# Patient Record
Sex: Female | Born: 1960 | Race: White | Hispanic: No | Marital: Married
Health system: Midwestern US, Academic
[De-identification: ages and names within clinical notes are randomized; demographics above are authoritative.]

## PROBLEM LIST (undated history)

## (undated) DIAGNOSIS — N189 Chronic kidney disease, unspecified: Principal | ICD-10-CM

## (undated) DIAGNOSIS — D631 Anemia in chronic kidney disease: Principal | ICD-10-CM

## (undated) DIAGNOSIS — K909 Intestinal malabsorption, unspecified: Principal | ICD-10-CM

## (undated) DIAGNOSIS — D508 Other iron deficiency anemias: Secondary | ICD-10-CM

## (undated) HISTORY — DX: Chronic kidney disease, unspecified: N18.9

## (undated) HISTORY — PX: CHOLECYSTECTOMY: SHX55

## (undated) HISTORY — DX: Intestinal malabsorption, unspecified: K90.9

## (undated) HISTORY — PX: APPENDECTOMY: SHX54

## (undated) HISTORY — PX: ABDOMINAL HYSTERECTOMY: SHX81

## (undated) HISTORY — DX: Anemia in chronic kidney disease: D63.1

## (undated) HISTORY — DX: Other iron deficiency anemias: D50.8

---

## 2010-12-19 DIAGNOSIS — E010 Iodine-deficiency related diffuse (endemic) goiter: Secondary | ICD-10-CM | POA: Insufficient documentation

## 2010-12-19 DIAGNOSIS — D649 Anemia, unspecified: Secondary | ICD-10-CM | POA: Insufficient documentation

## 2010-12-19 DIAGNOSIS — K644 Residual hemorrhoidal skin tags: Secondary | ICD-10-CM | POA: Insufficient documentation

## 2011-05-08 ENCOUNTER — Encounter (HOSPITAL_COMMUNITY): Payer: Self-pay | Admitting: *Deleted

## 2011-05-08 ENCOUNTER — Emergency Department (HOSPITAL_COMMUNITY)
Admission: EM | Admit: 2011-05-08 | Discharge: 2011-05-08 | Disposition: A | Discharge status: Home / Self Care | Payer: Managed Care, Other (non HMO) | Attending: Emergency Medicine | Admitting: Emergency Medicine

## 2011-05-08 DIAGNOSIS — K529 Noninfective gastroenteritis and colitis, unspecified: Secondary | ICD-10-CM

## 2011-05-08 DIAGNOSIS — E119 Type 2 diabetes mellitus without complications: Secondary | ICD-10-CM | POA: Insufficient documentation

## 2011-05-08 DIAGNOSIS — K5289 Other specified noninfective gastroenteritis and colitis: Secondary | ICD-10-CM | POA: Insufficient documentation

## 2011-05-08 DIAGNOSIS — R6883 Chills (without fever): Secondary | ICD-10-CM | POA: Insufficient documentation

## 2011-05-08 DIAGNOSIS — R109 Unspecified abdominal pain: Secondary | ICD-10-CM | POA: Insufficient documentation

## 2011-05-08 DIAGNOSIS — R112 Nausea with vomiting, unspecified: Secondary | ICD-10-CM | POA: Insufficient documentation

## 2011-05-08 LAB — URINALYSIS, ROUTINE W REFLEX MICROSCOPIC
Nitrite: NEGATIVE
Specific Gravity, Urine: 1.005 (ref 1.005–1.030)
Urobilinogen, UA: 0.2 mg/dL (ref 0.0–1.0)

## 2011-05-08 LAB — DIFFERENTIAL
Basophils Relative: 0 % (ref 0–1)
Eosinophils Absolute: 0 10*3/uL (ref 0.0–0.7)
Eosinophils Relative: 0 % (ref 0–5)
Lymphs Abs: 0.9 10*3/uL (ref 0.7–4.0)
Monocytes Relative: 14 % — ABNORMAL HIGH (ref 3–12)

## 2011-05-08 LAB — KETONES, QUALITATIVE: Acetone, Bld: NEGATIVE

## 2011-05-08 LAB — COMPREHENSIVE METABOLIC PANEL
BUN: 22 mg/dL (ref 6–23)
Calcium: 9.9 mg/dL (ref 8.4–10.5)
GFR calc Af Amer: 78 mL/min — ABNORMAL LOW (ref >90)
Glucose, Bld: 243 mg/dL — ABNORMAL HIGH (ref 70–99)
Total Protein: 6.6 g/dL (ref 6.0–8.3)

## 2011-05-08 LAB — CBC
MCH: 29.7 pg (ref 26.0–34.0)
MCHC: 33.9 g/dL (ref 30.0–36.0)
MCV: 87.6 fL (ref 78.0–100.0)
Platelets: 229 10*3/uL (ref 150–400)
RBC: 3.54 MIL/uL — ABNORMAL LOW (ref 3.87–5.11)

## 2011-05-08 LAB — URINE MICROSCOPIC-ADD ON

## 2011-05-08 MED ORDER — PROMETHAZINE HCL 25 MG/ML IJ SOLN
12.5000 mg | Freq: Once | INTRAMUSCULAR | Status: AC
  Administered 2011-05-08: 12.5 mg via INTRAVENOUS
  Filled 2011-05-08: qty 1

## 2011-05-08 MED ORDER — ONDANSETRON HCL 4 MG PO TABS: 4.0000 mg | ORAL_TABLET | Freq: Four times a day (QID) | ORAL | Status: AC

## 2011-05-08 MED ORDER — SODIUM CHLORIDE 0.9 % IV SOLN
Freq: Once | INTRAVENOUS | Status: AC
  Administered 2011-05-08: 07:00:00 via INTRAVENOUS

## 2011-05-08 MED ORDER — KETOROLAC TROMETHAMINE 30 MG/ML IJ SOLN
30.0000 mg | Freq: Once | INTRAMUSCULAR | Status: AC
  Administered 2011-05-08: 30 mg via INTRAVENOUS
  Filled 2011-05-08: qty 1

## 2011-05-08 NOTE — ED Provider Notes (Signed)
History     CSN: [id number]  Arrival date & time 05/08/11  0545   First MD Initiated Contact with Patient 05/08/11 0557      Chief Complaint  Patient presents with  . Nausea    (Consider location/radiation/quality/duration/timing/severity/associated sxs/prior treatment) HPI Comments: Patient has history of IDDM, cholecystectomy and appendectomy.  Patient is a 51 y.o. female presenting with vomiting. The history is provided by the patient.  Emesis  This is a new problem. Episode onset: 3 days ago. The problem occurs continuously. The problem has been gradually worsening. The emesis has an appearance of stomach contents. There has been no fever. Associated symptoms include abdominal pain and chills. Pertinent negatives include no diarrhea and no fever.    Past Medical History  Diagnosis Date  . Diabetes mellitus     Past Surgical History  Procedure Date  . Abdominal hysterectomy   . Appendectomy   . Cholecystectomy     No family history on file.  History  Substance Use Topics  . Smoking status: Never Smoker   . Smokeless tobacco: Not on file  . Alcohol Use: Yes    OB History    Grav Para Term Preterm Abortions TAB SAB Ect Mult Living                  Review of Systems  Constitutional: Positive for chills. Negative for fever.  Gastrointestinal: Positive for vomiting and abdominal pain. Negative for diarrhea.  All other systems reviewed and are negative.    Allergies  Penicillins and Sulfa antibiotics  Home Medications  No current outpatient prescriptions on file.  BP 131/88  Pulse 106  Temp(Src) 98.3 F (36.8 C) (Oral)  Resp 20  SpO2 99%  Physical Exam  Nursing note and vitals reviewed. Constitutional: She is oriented to person, place, and time. She appears well-developed and well-nourished. No distress.  HENT:  Head: Normocephalic and atraumatic.  Mouth/Throat: Oropharynx is clear and moist. No oropharyngeal exudate.  Neck: Normal range of  motion. Neck supple.  Cardiovascular: Normal rate and regular rhythm.  Exam reveals no gallop and no friction rub.   No murmur heard. Pulmonary/Chest: Effort normal and breath sounds normal. No respiratory distress. She has no wheezes.  Abdominal: Soft. Bowel sounds are normal. She exhibits no distension. There is no tenderness.  Musculoskeletal: Normal range of motion.  Neurological: She is alert and oriented to person, place, and time.  Skin: Skin is warm and dry. She is not diaphoretic.    ED Course  Procedures (including critical care time)   Labs Reviewed  CBC  DIFFERENTIAL  COMPREHENSIVE METABOLIC PANEL  KETONES, QUALITATIVE  URINALYSIS, ROUTINE W REFLEX MICROSCOPIC   No results found.   No diagnosis found.    MDM  She is feeling better after the medications given.  The electrolytes do not reflect DKA.  The abdomen is benign and the wbc is normal.  She will be discharged to home with gastroenteritis as the suspected etiology.       Geoffery Lyons, MD 05/08/11 [id number]

## 2011-05-08 NOTE — Discharge Instructions (Signed)
Viral Gastroenteritis Viral gastroenteritis is also known as stomach flu. This condition affects the stomach and intestinal tract. It can cause sudden diarrhea and vomiting. The illness typically lasts 3 to 8 days. Most people develop an immune response that eventually gets rid of the virus. While this natural response develops, the virus can make you quite ill. CAUSES  Many different viruses can cause gastroenteritis, such as rotavirus or noroviruses. You can catch one of these viruses by consuming contaminated food or water. You may also catch a virus by sharing utensils or other personal items with an infected person or by touching a contaminated surface. SYMPTOMS  The most common symptoms are diarrhea and vomiting. These problems can cause a severe loss of body fluids (dehydration) and a body salt (electrolyte) imbalance. Other symptoms may include:  Fever.   Headache.   Fatigue.   Abdominal pain.  DIAGNOSIS  Your caregiver can usually diagnose viral gastroenteritis based on your symptoms and a physical exam. A stool sample may also be taken to test for the presence of viruses or other infections. TREATMENT  This illness typically goes away on its own. Treatments are aimed at rehydration. The most serious cases of viral gastroenteritis involve vomiting so severely that you are not able to keep fluids down. In these cases, fluids must be given through an intravenous line (IV). HOME CARE INSTRUCTIONS   Drink enough fluids to keep your urine clear or pale yellow. Drink small amounts of fluids frequently and increase the amounts as tolerated.   Ask your caregiver for specific rehydration instructions.   Avoid:   Foods high in sugar.   Alcohol.   Carbonated drinks.   Tobacco.   Juice.   Caffeine drinks.   Extremely hot or cold fluids.   Fatty, greasy foods.   Too much intake of anything at one time.   Dairy products until 24 to 48 hours after diarrhea stops.   You may  consume probiotics. Probiotics are active cultures of beneficial bacteria. They may lessen the amount and number of diarrheal stools in adults. Probiotics can be found in yogurt with active cultures and in supplements.   Wash your hands well to avoid spreading the virus.   Only take over-the-counter or prescription medicines for pain, discomfort, or fever as directed by your caregiver. Do not give aspirin to children. Antidiarrheal medicines are not recommended.   Ask your caregiver if you should continue to take your regular prescribed and over-the-counter medicines.   Keep all follow-up appointments as directed by your caregiver.  SEEK IMMEDIATE MEDICAL CARE IF:   You are unable to keep fluids down.   You do not urinate at least once every 6 to 8 hours.   You develop shortness of breath.   You notice blood in your stool or vomit. This may look like coffee grounds.   You have abdominal pain that increases or is concentrated in one small area (localized).   You have persistent vomiting or diarrhea.   You have a fever.   The patient is a child younger than 3 months, and he or she has a fever.   The patient is a child older than 3 months, and he or she has a fever and persistent symptoms.   The patient is a child older than 3 months, and he or she has a fever and symptoms suddenly get worse.   The patient is a baby, and he or she has no tears when crying.  MAKE SURE YOU:     Understand these instructions.   Will watch your condition.   Will get help right away if you are not doing well or get worse.  Document Released: 02/02/2005 Document Revised: 01/22/2011 Document Reviewed: 11/19/2010 [employer] Patient Information 2012 [employer]. 

## 2011-05-08 NOTE — ED Notes (Signed)
Pt reports generalized abd pain and nausea starting 3 days ago

## 2012-05-02 DIAGNOSIS — E785 Hyperlipidemia, unspecified: Secondary | ICD-10-CM | POA: Insufficient documentation

## 2012-05-02 DIAGNOSIS — Z8349 Family history of other endocrine, nutritional and metabolic diseases: Secondary | ICD-10-CM | POA: Insufficient documentation

## 2012-05-02 DIAGNOSIS — N951 Menopausal and female climacteric states: Secondary | ICD-10-CM | POA: Insufficient documentation

## 2012-05-02 DIAGNOSIS — Z9289 Personal history of other medical treatment: Secondary | ICD-10-CM | POA: Insufficient documentation

## 2012-05-02 DIAGNOSIS — E109 Type 1 diabetes mellitus without complications: Secondary | ICD-10-CM | POA: Insufficient documentation

## 2012-05-02 DIAGNOSIS — Z90710 Acquired absence of both cervix and uterus: Secondary | ICD-10-CM | POA: Insufficient documentation

## 2012-06-07 ENCOUNTER — Other Ambulatory Visit: Payer: Self-pay | Admitting: Endocrinology

## 2012-06-07 DIAGNOSIS — E049 Nontoxic goiter, unspecified: Secondary | ICD-10-CM

## 2012-06-10 ENCOUNTER — Ambulatory Visit
Admission: RE | Admit: 2012-06-10 | Discharge: 2012-06-10 | Disposition: A | Discharge status: Home / Self Care | Payer: Managed Care, Other (non HMO) | Source: Ambulatory Visit | Attending: Endocrinology | Admitting: Endocrinology

## 2012-06-10 DIAGNOSIS — E049 Nontoxic goiter, unspecified: Secondary | ICD-10-CM

## 2012-06-20 ENCOUNTER — Other Ambulatory Visit: Payer: Self-pay | Admitting: Endocrinology

## 2012-06-20 DIAGNOSIS — E042 Nontoxic multinodular goiter: Secondary | ICD-10-CM

## 2012-06-21 ENCOUNTER — Other Ambulatory Visit: Payer: Managed Care, Other (non HMO)

## 2012-06-22 ENCOUNTER — Ambulatory Visit
Admission: RE | Admit: 2012-06-22 | Discharge: 2012-06-22 | Disposition: A | Discharge status: Home / Self Care | Payer: Managed Care, Other (non HMO) | Source: Ambulatory Visit | Attending: Endocrinology | Admitting: Endocrinology

## 2012-06-22 ENCOUNTER — Other Ambulatory Visit (HOSPITAL_COMMUNITY)
Admission: RE | Admit: 2012-06-22 | Discharge: 2012-06-22 | Disposition: A | Discharge status: Home / Self Care | Payer: Managed Care, Other (non HMO) | Source: Ambulatory Visit | Attending: Interventional Radiology | Admitting: Interventional Radiology

## 2012-06-22 DIAGNOSIS — E042 Nontoxic multinodular goiter: Secondary | ICD-10-CM

## 2012-06-22 DIAGNOSIS — E049 Nontoxic goiter, unspecified: Secondary | ICD-10-CM | POA: Insufficient documentation

## 2012-08-02 DIAGNOSIS — E041 Nontoxic single thyroid nodule: Secondary | ICD-10-CM | POA: Insufficient documentation

## 2012-08-02 DIAGNOSIS — E8940 Asymptomatic postprocedural ovarian failure: Secondary | ICD-10-CM | POA: Insufficient documentation

## 2012-10-05 ENCOUNTER — Encounter: Payer: Self-pay | Admitting: Nurse Practitioner

## 2012-10-20 ENCOUNTER — Ambulatory Visit: Payer: Self-pay | Admitting: Nurse Practitioner

## 2012-10-25 ENCOUNTER — Ambulatory Visit: Payer: Self-pay | Admitting: Nurse Practitioner

## 2012-11-16 ENCOUNTER — Other Ambulatory Visit: Payer: Self-pay | Admitting: Nurse Practitioner

## 2012-11-16 ENCOUNTER — Other Ambulatory Visit: Payer: Self-pay | Admitting: Endocrinology

## 2012-11-16 DIAGNOSIS — E049 Nontoxic goiter, unspecified: Secondary | ICD-10-CM

## 2012-11-16 NOTE — Telephone Encounter (Signed)
Left Message To Call Back  

## 2012-11-17 NOTE — Telephone Encounter (Signed)
I have attempted to contact this patient by phone with the following results: left message to return my call on answering machine (home/mobile).  

## 2012-11-18 NOTE — Telephone Encounter (Signed)
Pt returning call

## 2012-11-18 NOTE — Telephone Encounter (Signed)
Pt returned call.  AEX scheduled. 30 day supply sent and pt states she has a few pills left.

## 2012-11-18 NOTE — Telephone Encounter (Signed)
Message left to call back to schedule AEX in order to get refill.

## 2012-12-20 ENCOUNTER — Ambulatory Visit: Payer: Self-pay | Admitting: Nurse Practitioner

## 2012-12-30 ENCOUNTER — Telehealth: Payer: Self-pay | Admitting: Hematology & Oncology

## 2012-12-30 NOTE — Telephone Encounter (Signed)
I spoke w NEW PATIENT today to remind them of their appointment with Dr. Ennever. Also, advised them to bring all meds and insurance information. ° °

## 2013-01-02 ENCOUNTER — Ambulatory Visit (HOSPITAL_BASED_OUTPATIENT_CLINIC_OR_DEPARTMENT_OTHER): Payer: Managed Care, Other (non HMO) | Admitting: Hematology & Oncology

## 2013-01-02 ENCOUNTER — Other Ambulatory Visit (HOSPITAL_BASED_OUTPATIENT_CLINIC_OR_DEPARTMENT_OTHER): Payer: Managed Care, Other (non HMO) | Admitting: Lab

## 2013-01-02 ENCOUNTER — Ambulatory Visit: Payer: Managed Care, Other (non HMO)

## 2013-01-02 VITALS — BP 118/57 | HR 76 | Temp 98.3°F | Resp 14 | Ht 67.0 in | Wt 177.0 lb

## 2013-01-02 DIAGNOSIS — D649 Anemia, unspecified: Secondary | ICD-10-CM

## 2013-01-02 LAB — CBC WITH DIFFERENTIAL (CANCER CENTER ONLY)
BASO#: 0 10*3/uL (ref 0.0–0.2)
EOS%: 2.5 % (ref 0.0–7.0)
HGB: 10.7 g/dL — ABNORMAL LOW (ref 11.6–15.9)
MCH: 29.9 pg (ref 26.0–34.0)
MCHC: 32.5 g/dL (ref 32.0–36.0)
MONO%: 9.6 % (ref 0.0–13.0)
NEUT#: 3.6 10*3/uL (ref 1.5–6.5)
Platelets: 247 10*3/uL (ref 145–400)

## 2013-01-02 NOTE — Progress Notes (Signed)
This office note has been dictated.

## 2013-01-03 NOTE — Addendum Note (Signed)
Addended by: Arlan Organ R on: 01/03/2013 08:12 PM   Modules accepted: Orders

## 2013-01-04 ENCOUNTER — Telehealth: Payer: Self-pay | Admitting: Hematology & Oncology

## 2013-01-04 NOTE — Telephone Encounter (Signed)
Mailed February schedule °

## 2013-01-05 LAB — VITAMIN B12: Vitamin B-12: 563 pg/mL (ref 211–911)

## 2013-01-05 LAB — HEMOGLOBINOPATHY EVALUATION
Hemoglobin Other: 0 %
Hgb A: 97.2 % (ref 96.8–97.8)

## 2013-01-05 LAB — RETICULOCYTES (CHCC): ABS Retic: 33.3 10*3/uL (ref 19.0–186.0)

## 2013-01-07 NOTE — Progress Notes (Signed)
CC:   Maryelizabeth Rowan, M.D. Dorisann Frames, M.D.  DIAGNOSIS:  Normochromic normocytic anemia.  HISTORY OF PRESENT ILLNESS:  Ms. Chismar is an incredibly charming 52 year old white female.  She just had a birthday a couple weeks ago.  She lives in [place], [place].  Of course, then we had a great talk about football and the [employer].  She and her husband have been here for, I think, couple of years.  He works for [employer].  She works for a [employer].  She is followed by Dr. Anna Genre.  Ms. Judy has a long history of diabetes.  She has a history of 30 years of diabetes.  She is on an insulin pump.  She says that her hemoglobin A1c has still been on the high side.  There have been some issues with the insurance company trying to get the pump __________ taken care.  She has had issues with anemia.  She is not symptomatic with this from my point of view.  Lab work that we have on her that was sent over by her doctors office shows her to have some degree of anemia.  Lab work back from October 14 shows a hemoglobin of 10.8, hematocrit 33.  White cell count was 6.6. MCV was 90.  She has a normal BUN and creatinine.  She has normal liver function tests.  She does not crave ice.  She has not had a monthly cycle for several years.  She had a hysterectomy, I think, about 4 or 5 years ago.  She has had no persistent rashes.  She has had no swollen lymph glands.  She has her mammograms yearly.  She had a colonoscopy about 6 years ago. She had this, I think, when her gallbladder was taken out.  There has been no weight loss or weight gain.  She is not a vegetarian.  Again, we were asked to see her for the anemia.  PAST MEDICAL HISTORY: 1. Essentially revolves around her diabetes. 2. Hyperlipidemia. 3. Hot flashes. 4. Hypothyroidism.  ALLERGIES: 1. Penicillin. 2. Sulfa antibiotics.  MEDICATIONS:  Aspirin 81 mg p.o. daily, Enjuvia 0.45 mg p.o.  daily, Prozac 40 mg p.o. daily, Neurontin 300 mg p.o. t.i.d., insulin pump, Synthroid 0.075 mg daily, Zestril 10 mg p.o. daily, Zocor 40 mg p.o. daily.  SOCIAL HISTORY:  Negative for tobacco use.  She has social alcohol use. She likes beer.  She has no obvious occupational exposure.  FAMILY HISTORY:  Pretty much unrevealing for anemia.  There is no diabetes in the family.  There is no history of colon cancer in the family.  REVIEW OF SYSTEMS:  As stated in history of present illness.  No additional findings noted on a 12-system review.  PHYSICAL EXAMINATION:  General:  This is a well-developed, well- nourished white female, in no obvious distress.  Vital signs: Temperature of 98.3, pulse 76, respiratory rate 14, blood pressure 118/57.  Weight is 177 pounds.  Head and Neck:  Normocephalic, atraumatic skull.  There are no ocular or oral lesions.  She has no scleral icterus.  Pupils react appropriately.  She has no glossitis. There is no adenopathy in the neck.  Thyroid is somewhat enlarged diffusely.  She has no supraclavicular lymph nodes.  Lungs:  Clear bilaterally.  Cardiac:  Regular rate and rhythm with a normal S1 and S2. There are no murmurs, rubs, or bruits.  Abdomen:  Soft.  She has good bowel sounds.  She has some laparoscopy  scars that are well healed.  She has no abdominal mass.  There is no fluid wave.  There is no palpable hepatosplenomegaly.  Back:  No tenderness over the spine, ribs, or hips. Extremities:  No clubbing, cyanosis or edema.  She has good range motion of her joints.  She has good strength in her arms and legs.  Skin:  No rashes, ecchymoses, or petechia.  Neurological:  No focal neurological deficits.  LABORATORY STUDIES:  White cell count 5.6, hemoglobin 10.7, hematocrit 32.9, platelet count 247.  Her retic count is 0.9%.  Peripheral smear shows a normochromic, normocytic population of red blood cells.  I see no nucleated red blood cells.  There are  no microcytic cells.  She has no anisocytosis or poikilocytosis.  There are no spherocytes or schistocytes.  I see no rouleaux formation.  She has no target cells.  White cells appear normal in morphology and maturation.  There are no immature myeloid or lymphoid forms.  She has no atypical lymphocytes.  I see no blasts.  Platelets are adequate in number and size.  IMPRESSION:  Ms. Toya is a very charming 52 year old white female.  She has a 52 year old history of diabetes.  She has had some ophthalmological issues.  She had a cataract removed from her left eye a week ago.  I really have to believe that Ms. Ferg's anemia will be attributed to a low erythropoietin level.  I suspect that after 30 years of diabetes, her kidneys are not making erythropoietin all that efficiently and that she just has a low erythropoietin level.  I think that her reticulocyte count less than 1 is highly indicative of her marrow not getting the "signal" to produce red cells.  Her blood smear, again, is unremarkable.  I could not imagine her having an actual bone marrow disorder.  She clearly does not have any hemolytic process.  She is on a few medications.  I suppose that some anemia could be chronic from her medications.  I do not see any hematologic malignancy that we have to worry about. Her exam is pretty much nonfocal.  Again, I would have to believe that we are looking at erythropoietin deficiency as the cause for her anemia.  I would not put her on any Epogen right now.  I think she is asymptomatic.  I think that any hemoglobin above 10 would be okay for her.  I do not see a need for a bone marrow biopsy.  I do not see a need for any type of endoscopic procedure.  I would not think that she is iron deficient.  Once, we have the results back from our labs, then we will be able to figure out when to get Ms. Peets back.  Again, I spent a good hour with her.  She is very nice.  It was  fun talking to her.  She has a good understanding of what is going on with her blood work. She, again, is not that symptomatic from what I can tell.  Once, we get these results back from her lab work, then we will be able to get her back in.  I probably would like to get her in in about 3 months or so.    ______________________________ Josph Macho, M.D. PRE/MEDQ  D:  01/02/2013  T:  01/07/2013  Job:  [id number]

## 2013-04-04 ENCOUNTER — Ambulatory Visit: Payer: Managed Care, Other (non HMO) | Admitting: Hematology & Oncology

## 2013-04-04 ENCOUNTER — Other Ambulatory Visit: Payer: Managed Care, Other (non HMO) | Admitting: Lab

## 2013-04-05 ENCOUNTER — Other Ambulatory Visit: Payer: Managed Care, Other (non HMO) | Admitting: Lab

## 2013-04-05 ENCOUNTER — Ambulatory Visit: Payer: Managed Care, Other (non HMO) | Admitting: Hematology & Oncology

## 2013-04-07 ENCOUNTER — Other Ambulatory Visit (HOSPITAL_BASED_OUTPATIENT_CLINIC_OR_DEPARTMENT_OTHER): Payer: Managed Care, Other (non HMO) | Admitting: Lab

## 2013-04-07 ENCOUNTER — Encounter: Payer: Self-pay | Admitting: Hematology & Oncology

## 2013-04-07 ENCOUNTER — Ambulatory Visit (HOSPITAL_BASED_OUTPATIENT_CLINIC_OR_DEPARTMENT_OTHER): Payer: Managed Care, Other (non HMO) | Admitting: Hematology & Oncology

## 2013-04-07 VITALS — BP 114/59 | HR 83 | Temp 98.2°F | Resp 14 | Ht 67.0 in | Wt 176.0 lb

## 2013-04-07 DIAGNOSIS — D649 Anemia, unspecified: Secondary | ICD-10-CM

## 2013-04-07 DIAGNOSIS — E119 Type 2 diabetes mellitus without complications: Secondary | ICD-10-CM

## 2013-04-07 DIAGNOSIS — N189 Chronic kidney disease, unspecified: Secondary | ICD-10-CM

## 2013-04-07 DIAGNOSIS — D631 Anemia in chronic kidney disease: Secondary | ICD-10-CM

## 2013-04-07 LAB — CBC WITH DIFFERENTIAL (CANCER CENTER ONLY)
BASO#: 0 10*3/uL (ref 0.0–0.2)
BASO%: 0.2 % (ref 0.0–2.0)
EOS ABS: 0 10*3/uL (ref 0.0–0.5)
EOS%: 0.5 % (ref 0.0–7.0)
HCT: 31.9 % — ABNORMAL LOW (ref 34.8–46.6)
HGB: 10.3 g/dL — ABNORMAL LOW (ref 11.6–15.9)
LYMPH#: 0.9 10*3/uL (ref 0.9–3.3)
LYMPH%: 13.6 % — ABNORMAL LOW (ref 14.0–48.0)
MCH: 29.7 pg (ref 26.0–34.0)
MCHC: 32.3 g/dL (ref 32.0–36.0)
MCV: 92 fL (ref 81–101)
MONO#: 0.6 10*3/uL (ref 0.1–0.9)
MONO%: 9 % (ref 0.0–13.0)
NEUT%: 76.7 % (ref 39.6–80.0)
NEUTROS ABS: 5.1 10*3/uL (ref 1.5–6.5)
PLATELETS: 262 10*3/uL (ref 145–400)
RBC: 3.47 10*6/uL — AB (ref 3.70–5.32)
RDW: 12.9 % (ref 11.1–15.7)
WBC: 6.6 10*3/uL (ref 3.9–10.0)

## 2013-04-07 LAB — CHCC SATELLITE - SMEAR

## 2013-04-07 LAB — RETICULOCYTES (CHCC)
ABS RETIC: 35.3 10*3/uL (ref 19.0–186.0)
RBC.: 3.53 MIL/uL — ABNORMAL LOW (ref 3.87–5.11)
RETIC CT PCT: 1 % (ref 0.4–2.3)

## 2013-04-10 ENCOUNTER — Encounter: Payer: Self-pay | Admitting: Hematology & Oncology

## 2013-04-10 DIAGNOSIS — N189 Chronic kidney disease, unspecified: Secondary | ICD-10-CM

## 2013-04-10 DIAGNOSIS — D631 Anemia in chronic kidney disease: Secondary | ICD-10-CM

## 2013-04-10 HISTORY — DX: Anemia in chronic kidney disease: D63.1

## 2013-04-10 HISTORY — DX: Chronic kidney disease, unspecified: N18.9

## 2013-04-10 NOTE — Progress Notes (Signed)
Hematology and Oncology Follow Up Visit  Sherrine MaplesCheryl Esper [id number] [birth year] 53 y.o. 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  [name] is back for a second office visit. We first saw her back in November. At that point in time, she was mildly anemic. We did our routine anemia studies.we found that she had a markedly low erythropoietin level of 5. Her iron level, measured bytransferrin receptor was good at 1.0 . Her reticulocyte count was less than 1.  She is doing okay. She still gets quite tired. She is working blood 7 more difficulty with work.  She's had no bleeding. He's had no fever. She's had a good appetite. She's not a vegetarian.  We also check her vitamin B 12 level and this was normal at 563.  Medications: Current outpatient prescriptions:aspirin 81 MG tablet, Take 81 mg by mouth daily., Disp: , Rfl: ;  buPROPion (WELLBUTRIN XL) 300 MG 24 hr tablet, Take 300 mg by mouth daily., Disp: , Rfl: ;  estrogens conjugated, synthetic B, (ENJUVIA) 0.45 MG tablet, Take 0.45 mg by mouth daily., Disp: , Rfl: ;  FLUoxetine (PROZAC) 40 MG capsule, Take 40 mg by mouth daily. , Disp: , Rfl: ;  FREESTYLE TEST STRIPS test strip, , Disp: , Rfl:  gabapentin (NEURONTIN) 300 MG capsule, Take 300 mg by mouth 3 (three) times daily. 1 in AM-  1 in PM  & 2 at BEDTIME, Disp: , Rfl: ;  insulin aspart (NOVOLOG) 100 UNIT/ML injection, Inject into the skin 3 (three) times daily before meals. Insulin pump, Disp: , Rfl: ;  levothyroxine (SYNTHROID, LEVOTHROID) 75 MCG tablet, Take 75 mcg by mouth daily before breakfast. , Disp: , Rfl:  lisinopril (PRINIVIL,ZESTRIL) 20 MG tablet, Take 10 mg by mouth daily. , Disp: , Rfl: ;  simvastatin (ZOCOR) 40 MG tablet, Take 40 mg by mouth every evening., Disp: , Rfl:   Allergies:  Allergies  Allergen Reactions  . Penicillins   . Sulfa Antibiotics      Past Medical History, Surgical history, Social history, and Family History were reviewed and updated.  Review of Systems: As above  Physical Exam:  height is 5\' 7"  (1.702 m) and weight is 176 lb (79.833 kg). Her oral temperature is 98.2 F (36.8 C). Her blood pressure is 114/59 and her pulse is 83. Her respiration is 14.   3 well-nourished white female. She is alert and oriented. Head and neck exam shows no ocular or oral lesions. She has no adenopathy. There is no palpable thyroid. Lungs are clear. Cardiac exam regular in rhythm with no murmurs rubs or bruits. Abdomen is soft. She good bowel sounds. There is no fluid wave. There is no palpable liver or spleen tip. Back exam no tenderness over the spine. Extremities shows no clubbing cyanosis or edema. She good strength. Skin exam no rashes. Neurological exam is nonfocal.  Lab Results  Component Value Date   WBC 6.6 04/07/2013   HGB 10.3* 04/07/2013   HCT 31.9* 04/07/2013   MCV 92 04/07/2013   PLT 262 04/07/2013     Chemistry      Component Value Date/Time   NA 134* 05/08/2011 0530   K 4.3 05/08/2011 0530   CL 98 05/08/2011 0530   CO2 29 05/08/2011 0530   BUN 22 05/08/2011 0530   CREATININE 0.98 05/08/2011 0530      Component  Value Date/Time   CALCIUM 9.9 05/08/2011 0530   ALKPHOS 51 05/08/2011 0530   AST 25 05/08/2011 0530   ALT 28 05/08/2011 0530   BILITOT 0.2* 05/08/2011 0530     I looked her blood smear. She is a normochromic normocytic probably separate blood cells. There are no nucleated red blood cells. I see no teardrop cells. There are no schistocytes or spherocytes. White cells per normal in morphology maturation. There are no hypersegmented polys. There are no atypical lymphocytes. Platelets are adequate number size.    Impression and Plan: Ms. Migliaccio is a 53 year old white female. She has anemia. His insulin dependent. She has a pump. She is markedly deficient of erythropoietin.  I think the only weight to improve her  anemia is with ESA administration. I spoke to her about this. I showed her the lab work. I explained  Why her bone marrow cannot make red blood cells. I told her that we can improve this. Iron, for right now, will not help.  I went over the side effects of Aranesp. I told her without a less than 5% risk of a thromboembolic event, stroke or heart attack.  I told her that weight watch her  blood pressure.  We will go ahead and get her set up with her first dose next week.  I want to see her back in 3- 4 weeks and we'll see how she responds.  I spent 40 minutes with her.     Josph Macho, MD 2/23/20156:08 PM

## 2013-04-11 ENCOUNTER — Telehealth: Payer: Self-pay | Admitting: Hematology & Oncology

## 2013-04-11 NOTE — Telephone Encounter (Signed)
Pt aware of 2-25 inj and 3-25 MD. Delice Bisonara aware to precert

## 2013-04-12 ENCOUNTER — Ambulatory Visit: Payer: Managed Care, Other (non HMO)

## 2013-04-19 ENCOUNTER — Telehealth: Payer: Self-pay | Admitting: Hematology & Oncology

## 2013-04-19 NOTE — Telephone Encounter (Signed)
Pt called left message wanting to know status of precert for inj. She is aware per Delice Bisonara they are going to try to get approval to give inj on 3-25.

## 2013-04-20 ENCOUNTER — Ambulatory Visit (HOSPITAL_BASED_OUTPATIENT_CLINIC_OR_DEPARTMENT_OTHER): Payer: Managed Care, Other (non HMO)

## 2013-04-20 ENCOUNTER — Telehealth: Payer: Self-pay | Admitting: Hematology & Oncology

## 2013-04-20 ENCOUNTER — Encounter: Payer: Self-pay | Admitting: Hematology & Oncology

## 2013-04-20 VITALS — BP 110/68 | HR 80 | Temp 98.1°F | Resp 18

## 2013-04-20 DIAGNOSIS — D631 Anemia in chronic kidney disease: Secondary | ICD-10-CM

## 2013-04-20 DIAGNOSIS — N039 Chronic nephritic syndrome with unspecified morphologic changes: Secondary | ICD-10-CM

## 2013-04-20 DIAGNOSIS — N189 Chronic kidney disease, unspecified: Secondary | ICD-10-CM

## 2013-04-20 MED ORDER — DARBEPOETIN ALFA-POLYSORBATE 300 MCG/0.6ML IJ SOLN
300.0000 ug | Freq: Once | INTRAMUSCULAR | Status: AC
  Administered 2013-04-20: 300 ug via SUBCUTANEOUS

## 2013-04-20 MED ORDER — DARBEPOETIN ALFA-POLYSORBATE 300 MCG/0.6ML IJ SOLN
INTRAMUSCULAR | Status: AC
Start: 2013-04-20 — End: 2013-04-20
  Filled 2013-04-20: qty 0.6

## 2013-04-20 NOTE — Patient Instructions (Signed)
Darbepoetin Alfa injection What is this medicine? DARBEPOETIN ALFA (dar be POE e tin AL fa) helps your body make more red blood cells. It is used to treat anemia caused by chronic kidney failure and chemotherapy. This medicine may be used for other purposes; ask your health care provider or pharmacist if you have questions. COMMON BRAND NAME(S): Aranesp What should I tell my health care provider before I take this medicine? They need to know if you have any of these conditions: -blood clotting disorders or history of blood clots -cancer patient not on chemotherapy -cystic fibrosis -heart disease, such as angina, heart failure, or a history of a heart attack -hemoglobin level of 12 g/dL or greater -high blood pressure -low levels of folate, iron, or vitamin B12 -seizures -an unusual or allergic reaction to darbepoetin, erythropoietin, albumin, hamster proteins, latex, other medicines, foods, dyes, or preservatives -pregnant or trying to get pregnant -breast-feeding How should I use this medicine? This medicine is for injection into a vein or under the skin. It is usually given by a health care professional in a hospital or clinic setting. If you get this medicine at home, you will be taught how to prepare and give this medicine. Do not shake the solution before you withdraw a dose. Use exactly as directed. Take your medicine at regular intervals. Do not take your medicine more often than directed. It is important that you put your used needles and syringes in a special sharps container. Do not put them in a trash can. If you do not have a sharps container, call your pharmacist or healthcare provider to get one. Talk to your pediatrician regarding the use of this medicine in children. While this medicine may be used in children as young as 1 year for selected conditions, precautions do apply. Overdosage: If you think you have taken too much of this medicine contact a poison control center or  emergency room at once. NOTE: This medicine is only for you. Do not share this medicine with others. What if I miss a dose? If you miss a dose, take it as soon as you can. If it is almost time for your next dose, take only that dose. Do not take double or extra doses. What may interact with this medicine? Do not take this medicine with any of the following medications: -epoetin alfa This list may not describe all possible interactions. Give your health care provider a list of all the medicines, herbs, non-prescription drugs, or dietary supplements you use. Also tell them if you smoke, drink alcohol, or use illegal drugs. Some items may interact with your medicine. What should I watch for while using this medicine? Visit your prescriber or health care professional for regular checks on your progress and for the needed blood tests and blood pressure measurements. It is especially important for the doctor to make sure your hemoglobin level is in the desired range, to limit the risk of potential side effects and to give you the best benefit. Keep all appointments for any recommended tests. Check your blood pressure as directed. Ask your doctor what your blood pressure should be and when you should contact him or her. As your body makes more red blood cells, you may need to take iron, folic acid, or vitamin B supplements. Ask your doctor or health care provider which products are right for you. If you have kidney disease continue dietary restrictions, even though this medication can make you feel better. Talk with your doctor or health   care professional about the foods you eat and the vitamins that you take. What side effects may I notice from receiving this medicine? Side effects that you should report to your doctor or health care professional as soon as possible: -allergic reactions like skin rash, itching or hives, swelling of the face, lips, or tongue -breathing problems -changes in vision -chest  pain -confusion, trouble speaking or understanding -feeling faint or lightheaded, falls -high blood pressure -muscle aches or pains -pain, swelling, warmth in the leg -rapid weight gain -severe headaches -sudden numbness or weakness of the face, arm or leg -trouble walking, dizziness, loss of balance or coordination -seizures (convulsions) -swelling of the ankles, feet, hands -unusually weak or tired Side effects that usually do not require medical attention (report to your doctor or health care professional if they continue or are bothersome): -diarrhea -fever, chills (flu-like symptoms) -headaches -nausea, vomiting -redness, stinging, or swelling at site where injected This list may not describe all possible side effects. Call your doctor for medical advice about side effects. You may report side effects to FDA at 1-800-FDA-1088. Where should I keep my medicine? Keep out of the reach of children. Store in a refrigerator between 2 and 8 degrees C (36 and 46 degrees F). Do not freeze. Do not shake. Throw away any unused portion if using a single-dose vial. Throw away any unused medicine after the expiration date. NOTE: This sheet is a summary. It may not cover all possible information. If you have questions about this medicine, talk to your doctor, pharmacist, or health care provider.  2014, Elsevier/Gold Standard. (2008-01-17 10:23:57)  

## 2013-04-20 NOTE — Telephone Encounter (Signed)
[employer] Nbr: [id number] Status: Approved Dates: 04/20/2013 - 10/16/2013 CPT: [id number]

## 2013-04-20 NOTE — Progress Notes (Signed)
Pt was given first dose of Aranesp, after walking out she felt light-headed, dizzy, weak, sweaty. Pt stated she was a little nervous about the shot so it could be her nerves or her blood sugar. Blood sugar was checked and resulted at 136. Pt observed for 30min. bp was low, offered her drinks and cool towe. After about 5min. Pt stated she felt better she "thinks her nerves just got the best of her". Pt feels much better but continued to observe. VSS at discharge. Pt denied any further complications. Dr. Myna HidalgoEnnever aware. Pt instructed if she had any further complications to not hesitate to contact our office.

## 2013-05-10 ENCOUNTER — Ambulatory Visit (HOSPITAL_BASED_OUTPATIENT_CLINIC_OR_DEPARTMENT_OTHER): Payer: Managed Care, Other (non HMO) | Admitting: Hematology & Oncology

## 2013-05-10 ENCOUNTER — Other Ambulatory Visit (HOSPITAL_BASED_OUTPATIENT_CLINIC_OR_DEPARTMENT_OTHER): Payer: Managed Care, Other (non HMO) | Admitting: Lab

## 2013-05-10 ENCOUNTER — Encounter: Payer: Self-pay | Admitting: Hematology & Oncology

## 2013-05-10 ENCOUNTER — Ambulatory Visit: Payer: Managed Care, Other (non HMO)

## 2013-05-10 VITALS — BP 126/62 | HR 72 | Temp 98.1°F | Resp 14 | Ht 66.0 in | Wt 174.0 lb

## 2013-05-10 DIAGNOSIS — D631 Anemia in chronic kidney disease: Secondary | ICD-10-CM

## 2013-05-10 DIAGNOSIS — N189 Chronic kidney disease, unspecified: Secondary | ICD-10-CM

## 2013-05-10 DIAGNOSIS — N039 Chronic nephritic syndrome with unspecified morphologic changes: Secondary | ICD-10-CM

## 2013-05-10 LAB — CBC WITH DIFFERENTIAL (CANCER CENTER ONLY)
BASO#: 0 10*3/uL (ref 0.0–0.2)
BASO%: 0.3 % (ref 0.0–2.0)
EOS%: 0.3 % (ref 0.0–7.0)
Eosinophils Absolute: 0 10*3/uL (ref 0.0–0.5)
HCT: 36.6 % (ref 34.8–46.6)
HGB: 11.9 g/dL (ref 11.6–15.9)
LYMPH#: 0.6 10*3/uL — AB (ref 0.9–3.3)
LYMPH%: 15.2 % (ref 14.0–48.0)
MCH: 30.5 pg (ref 26.0–34.0)
MCHC: 32.5 g/dL (ref 32.0–36.0)
MCV: 94 fL (ref 81–101)
MONO#: 0.5 10*3/uL (ref 0.1–0.9)
MONO%: 13.2 % — ABNORMAL HIGH (ref 0.0–13.0)
NEUT%: 71 % (ref 39.6–80.0)
NEUTROS ABS: 2.8 10*3/uL (ref 1.5–6.5)
PLATELETS: 218 10*3/uL (ref 145–400)
RBC: 3.9 10*6/uL (ref 3.70–5.32)
RDW: 13.7 % (ref 11.1–15.7)
WBC: 3.9 10*3/uL (ref 3.9–10.0)

## 2013-05-10 LAB — RETICULOCYTES (CHCC)
ABS Retic: 39.6 10*3/uL (ref 19.0–186.0)
RBC.: 3.96 MIL/uL (ref 3.87–5.11)
RETIC CT PCT: 1 % (ref 0.4–2.3)

## 2013-05-10 LAB — CHCC SATELLITE - SMEAR

## 2013-05-10 LAB — IRON AND TIBC CHCC
%SAT: 19 % — ABNORMAL LOW (ref 21–57)
Iron: 56 ug/dL (ref 41–142)
TIBC: 290 ug/dL (ref 236–444)
UIBC: 234 ug/dL (ref 120–384)

## 2013-05-10 LAB — FERRITIN CHCC: Ferritin: 33 ng/ml (ref 9–269)

## 2013-05-10 NOTE — Progress Notes (Signed)
Hematology and Oncology Follow Up Visit  Miranda MaplesCheryl Allen [id number] 1960-11-16 53 y.o. 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 is back for followup. We did get go-ahead to give her a dose of Aranesp. This was given about a month ago. She had no problems with this. At that point in time, her reticulocyte count was less than 1. It is obvious that her bone marrow is not going to function well unless we provide erythropoietin to her. She feels better. She's not as dizzy. She has more energy.  Her blood sugars have been doing okay. She has a portable insulin pump.  She's had no fever. There's been no rashes. She's had no leg swelling.  Medications: Current outpatient prescriptions:aspirin 81 MG tablet, Take 81 mg by mouth daily., Disp: , Rfl: ;  buPROPion (WELLBUTRIN XL) 300 MG 24 hr tablet, Take 300 mg by mouth daily., Disp: , Rfl: ;  estrogens conjugated, synthetic B, (ENJUVIA) 0.45 MG tablet, Take 0.45 mg by mouth daily., Disp: , Rfl: ;  FLUoxetine (PROZAC) 40 MG capsule, Take 40 mg by mouth daily. , Disp: , Rfl:  FREESTYLE TEST STRIPS test strip, 1 each by Other route 2 (two) times daily. , Disp: , Rfl: ;  gabapentin (NEURONTIN) 300 MG capsule, Take 300 mg by mouth 3 (three) times daily. 1 in AM-  1 in PM  & 2 at BEDTIME, Disp: , Rfl: ;  insulin aspart (NOVOLOG) 100 UNIT/ML injection, Inject into the skin 3 (three) times daily before meals. Insulin pump, Disp: , Rfl:  levothyroxine (SYNTHROID, LEVOTHROID) 75 MCG tablet, Take 75 mcg by mouth daily before breakfast. , Disp: , Rfl: ;  lisinopril (PRINIVIL,ZESTRIL) 20 MG tablet, Take 10 mg by mouth daily. , Disp: , Rfl: ;  simvastatin (ZOCOR) 40 MG tablet, Take 40 mg by mouth every evening., Disp: , Rfl:   Allergies:  Allergies  Allergen Reactions  . Penicillins   . Sulfa Antibiotics      Past Medical History, Surgical history, Social history, and Family History were reviewed and updated.  Review of Systems: As above  Physical Exam:  height is 5\' 6"  (1.676 m) and weight is 174 lb (78.926 kg). Her oral temperature is 98.1 F (36.7 C). Her blood pressure is 126/62 and her pulse is 72. Her respiration is 14.   Well-developed and well-nourished. Lungs are clear. Cardiac exam regular rate and rhythm with no murmurs rubs or bruits. Abdomen is soft. She has good bowel sounds. There is no fluid wave. There is no palpable liver or spleen. Back exam no tenderness over the spine. Extremities no clubbing cyanosis or edema. Good range of motion of her joints. Skin exam no rashes. Neurological exam no focal neurological deficits.  Lab Results  Component Value Date   WBC 3.9 05/10/2013   HGB 11.9 05/10/2013   HCT 36.6 05/10/2013   MCV 94 05/10/2013   PLT 218 05/10/2013     Chemistry      Component Value Date/Time   NA 134* 05/08/2011 0530   K 4.3 05/08/2011 0530   CL 98 05/08/2011 0530   CO2 29 05/08/2011 0530   BUN 22 05/08/2011 0530   CREATININE 0.98 05/08/2011 0530      Component Value Date/Time   CALCIUM 9.9 05/08/2011 0530   ALKPHOS 51 05/08/2011 0530  AST 25 05/08/2011 0530   ALT 28 05/08/2011 0530   BILITOT 0.2* 05/08/2011 0530         Impression and Plan: Miranda Allen is 53 year old female. She has long-standing insulin dependent diabetes. She has a very low erythropoietin level. We gave her Aranesp. As expected, this worked perfectly. Her hemoglobin came up quite nicely. She feels better.  We will go ahead and plan to get her back now in 2 months. I am sure that she will will probably need Aranesp in the future.  We will also to watch out for iron deficiency.  Miranda Macho, MD 3/25/20158:53 AM

## 2013-05-16 ENCOUNTER — Telehealth: Payer: Self-pay | Admitting: Nurse Practitioner

## 2013-05-16 ENCOUNTER — Other Ambulatory Visit: Payer: Self-pay | Admitting: Nurse Practitioner

## 2013-05-16 DIAGNOSIS — D631 Anemia in chronic kidney disease: Secondary | ICD-10-CM

## 2013-05-16 DIAGNOSIS — N189 Chronic kidney disease, unspecified: Secondary | ICD-10-CM

## 2013-05-16 NOTE — Telephone Encounter (Addendum)
Message copied by Glee ArvinPICKENPACK-COUSAR, Danica Camarena N on Tue May 16, 2013  3:07 PM ------      Message from: Josph MachoENNEVER, PETER R      Created: Fri May 12, 2013  6:45 AM       Call - iron is low.. This may be why you still feel a little tired. Please set up Feraheme 1020mg  x 1 dose.  Pete ------Pt verbalized understanding. She has been scheduled for 05/23/13 @ 0830.

## 2013-05-22 ENCOUNTER — Ambulatory Visit
Admission: RE | Admit: 2013-05-22 | Discharge: 2013-05-22 | Disposition: A | Discharge status: Home / Self Care | Payer: Managed Care, Other (non HMO) | Source: Ambulatory Visit | Attending: Endocrinology | Admitting: Endocrinology

## 2013-05-22 DIAGNOSIS — E049 Nontoxic goiter, unspecified: Secondary | ICD-10-CM

## 2013-05-23 ENCOUNTER — Ambulatory Visit (HOSPITAL_BASED_OUTPATIENT_CLINIC_OR_DEPARTMENT_OTHER): Payer: Managed Care, Other (non HMO)

## 2013-05-23 VITALS — BP 127/78 | HR 67 | Temp 97.0°F | Resp 16

## 2013-05-23 DIAGNOSIS — N189 Chronic kidney disease, unspecified: Secondary | ICD-10-CM

## 2013-05-23 DIAGNOSIS — D631 Anemia in chronic kidney disease: Secondary | ICD-10-CM

## 2013-05-23 DIAGNOSIS — N039 Chronic nephritic syndrome with unspecified morphologic changes: Secondary | ICD-10-CM

## 2013-05-23 MED ORDER — SODIUM CHLORIDE 0.9 % IV SOLN
INTRAVENOUS | Status: DC
  Administered 2013-05-23: 09:00:00 via INTRAVENOUS

## 2013-05-23 MED ORDER — SODIUM CHLORIDE 0.9 % IV SOLN
1020.0000 mg | Freq: Once | INTRAVENOUS | Status: AC
  Administered 2013-05-23: 1020 mg via INTRAVENOUS
  Filled 2013-05-23: qty 34

## 2013-05-23 NOTE — Patient Instructions (Signed)

## 2013-07-13 ENCOUNTER — Encounter: Payer: Self-pay | Admitting: Hematology & Oncology

## 2013-07-13 ENCOUNTER — Other Ambulatory Visit (HOSPITAL_BASED_OUTPATIENT_CLINIC_OR_DEPARTMENT_OTHER): Payer: Managed Care, Other (non HMO) | Admitting: Lab

## 2013-07-13 ENCOUNTER — Ambulatory Visit: Payer: Managed Care, Other (non HMO)

## 2013-07-13 ENCOUNTER — Ambulatory Visit (HOSPITAL_BASED_OUTPATIENT_CLINIC_OR_DEPARTMENT_OTHER): Payer: Managed Care, Other (non HMO) | Admitting: Hematology & Oncology

## 2013-07-13 VITALS — BP 99/56 | HR 73 | Temp 98.0°F | Resp 14 | Ht 67.0 in | Wt 171.0 lb

## 2013-07-13 DIAGNOSIS — D649 Anemia, unspecified: Secondary | ICD-10-CM

## 2013-07-13 DIAGNOSIS — E119 Type 2 diabetes mellitus without complications: Secondary | ICD-10-CM

## 2013-07-13 DIAGNOSIS — D631 Anemia in chronic kidney disease: Secondary | ICD-10-CM

## 2013-07-13 DIAGNOSIS — N189 Chronic kidney disease, unspecified: Secondary | ICD-10-CM

## 2013-07-13 LAB — CBC WITH DIFFERENTIAL (CANCER CENTER ONLY)
BASO#: 0 10*3/uL (ref 0.0–0.2)
BASO%: 0 % (ref 0.0–2.0)
EOS%: 0.3 % (ref 0.0–7.0)
Eosinophils Absolute: 0 10*3/uL (ref 0.0–0.5)
HEMATOCRIT: 34.6 % — AB (ref 34.8–46.6)
HEMOGLOBIN: 11.5 g/dL — AB (ref 11.6–15.9)
LYMPH#: 0.7 10*3/uL — ABNORMAL LOW (ref 0.9–3.3)
LYMPH%: 17 % (ref 14.0–48.0)
MCH: 30.4 pg (ref 26.0–34.0)
MCHC: 33.2 g/dL (ref 32.0–36.0)
MCV: 92 fL (ref 81–101)
MONO#: 0.4 10*3/uL (ref 0.1–0.9)
MONO%: 10.9 % (ref 0.0–13.0)
NEUT%: 71.8 % (ref 39.6–80.0)
NEUTROS ABS: 2.8 10*3/uL (ref 1.5–6.5)
Platelets: 218 10*3/uL (ref 145–400)
RBC: 3.78 10*6/uL (ref 3.70–5.32)
RDW: 12.7 % (ref 11.1–15.7)
WBC: 3.9 10*3/uL (ref 3.9–10.0)

## 2013-07-13 LAB — IRON AND TIBC CHCC
%SAT: 42 % (ref 21–57)
Iron: 93 ug/dL (ref 41–142)
TIBC: 224 ug/dL — AB (ref 236–444)
UIBC: 130 ug/dL (ref 120–384)

## 2013-07-13 LAB — RETICULOCYTES (CHCC)
ABS Retic: 22.9 10*3/uL (ref 19.0–186.0)
RBC.: 3.82 MIL/uL — AB (ref 3.87–5.11)
Retic Ct Pct: 0.6 % (ref 0.4–2.3)

## 2013-07-13 LAB — FERRITIN CHCC: Ferritin: 401 ng/ml — ABNORMAL HIGH (ref 9–269)

## 2013-07-13 NOTE — Progress Notes (Signed)
Hematology and Oncology Follow Up Visit  Miranda Allen [id number] 1961-01-07 53 y.o. 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 is back for followup. We did get go-ahead to give her a dose of Aranesp. This was given about 2 months ago. She had no problems with this. At that point time, her reticulocyte count was less than 1. It is obvious that her bone marrow is not going to function well unless we provide erythropoietin to her. She feels better. She's not as dizzy. She has more energy.  Her blood sugars have been doing okay. She has a portable insulin pump.  She's had no fever. There's been no rashes. She's had no leg swelling.  She had a wonderful Memorial Day weekend. She went on her motorcycle up to [place] for a rally.  Work has been doing well. 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  Medications: Current outpatient prescriptions:aspirin 81 MG tablet, Take 81 mg by mouth daily., Disp: , Rfl: ;  buPROPion (WELLBUTRIN SR) 150 MG 12 hr tablet, Take 150 mg by mouth daily., Disp: , Rfl: ;  clobetasol cream (TEMOVATE) 0.05 %, Apply topically as needed. , Disp: , Rfl: ;  estrogens conjugated, synthetic B, (ENJUVIA) 0.45 MG tablet, Take 0.45 mg by mouth daily., Disp: , Rfl:  FLUoxetine (PROZAC) 40 MG capsule, Take 40 mg by mouth daily. , Disp: , Rfl: ;  FREESTYLE TEST STRIPS test strip, 1 each by Other route 2 (two) times daily. , Disp: , Rfl: ;  gabapentin (NEURONTIN) 300 MG capsule, Take 300 mg by mouth 3 (three) times daily. 1 in AM-  1 in PM  & 2 at BEDTIME, Disp: , Rfl: ;  hydrocortisone 2.5 % cream, Apply topically as needed. , Disp: , Rfl:  insulin aspart (NOVOLOG) 100 UNIT/ML injection, Inject into the skin as needed. Insulin pump, Pt bolus when she eat's, Disp: , Rfl: ;  levothyroxine (SYNTHROID,  LEVOTHROID) 75 MCG tablet, Take 75 mcg by mouth daily before breakfast. , Disp: , Rfl: ;  lisinopril (PRINIVIL,ZESTRIL) 20 MG tablet, Take 10 mg by mouth daily. , Disp: , Rfl: ;  simvastatin (ZOCOR) 40 MG tablet, Take 40 mg by mouth every evening., Disp: , Rfl:   Allergies:  Allergies  Allergen Reactions  . Penicillins   . Sulfa Antibiotics     Past Medical History, Surgical history, Social history, and Family History were reviewed and updated.  Review of Systems: As above  Physical Exam:  height is 5\' 7"  (1.702 m) and weight is 171 lb (77.565 kg). Her oral temperature is 98 F (36.7 C). Her blood pressure is 99/56 and her pulse is 73. Her respiration is 14.   Well-developed and well-nourished. Lungs are clear. Cardiac exam regular rate and rhythm with no murmurs rubs or bruits. Abdomen is soft. She has good bowel sounds. There is no fluid wave. There is no palpable liver or spleen. Back exam no tenderness over the spine. Extremities no clubbing cyanosis or edema. Good range of motion of her joints. Skin exam no rashes. Neurological exam no focal neurological deficits.  Lab Results  Component Value Date   WBC 3.9 07/13/2013   HGB 11.5* 07/13/2013   HCT 34.6* 07/13/2013   MCV 92 07/13/2013   PLT 218 07/13/2013     Chemistry  Component Value Date/Time   NA 134* 05/08/2011 0530   K 4.3 05/08/2011 0530   CL 98 05/08/2011 0530   CO2 29 05/08/2011 0530   BUN 22 05/08/2011 0530   CREATININE 0.98 05/08/2011 0530      Component Value Date/Time   CALCIUM 9.9 05/08/2011 0530   ALKPHOS 51 05/08/2011 0530   AST 25 05/08/2011 0530   ALT 28 05/08/2011 0530   BILITOT 0.2* 05/08/2011 0530         Impression and Plan: Miranda Allen is 53 year old female. She has long-standing insulin dependent diabetes. She has a very low erythropoietin level. We gave her Aranesp. As expected, this worked perfectly. Her hemoglobin came up quite nicely. She feels better.  We will go ahead and plan to get her back  now in 2 months. I am sure that she will will probably need Aranesp in the future.  We will also to watch out for iron deficiency.  Josph MachoPeter R Konstantinos Cordoba, MD 5/28/20159:12 AM

## 2013-07-17 ENCOUNTER — Telehealth: Payer: Self-pay | Admitting: *Deleted

## 2013-07-17 NOTE — Telephone Encounter (Signed)
Message copied by Anselm Jungling on Mon Jul 17, 2013 11:56 AM ------      Message from: Arlan Organ R      Created: Thu Jul 13, 2013  6:01 PM       Please call and let her know that her iron level is okay. Thank you. Pete ------

## 2013-09-14 ENCOUNTER — Ambulatory Visit (HOSPITAL_BASED_OUTPATIENT_CLINIC_OR_DEPARTMENT_OTHER): Payer: Managed Care, Other (non HMO)

## 2013-09-14 ENCOUNTER — Ambulatory Visit (HOSPITAL_BASED_OUTPATIENT_CLINIC_OR_DEPARTMENT_OTHER): Payer: Managed Care, Other (non HMO) | Admitting: Hematology & Oncology

## 2013-09-14 ENCOUNTER — Other Ambulatory Visit (HOSPITAL_BASED_OUTPATIENT_CLINIC_OR_DEPARTMENT_OTHER): Payer: Managed Care, Other (non HMO) | Admitting: Lab

## 2013-09-14 ENCOUNTER — Encounter: Payer: Self-pay | Admitting: Hematology & Oncology

## 2013-09-14 VITALS — BP 106/74 | HR 71 | Temp 97.8°F | Resp 14 | Ht 67.0 in | Wt 173.0 lb

## 2013-09-14 DIAGNOSIS — N039 Chronic nephritic syndrome with unspecified morphologic changes: Secondary | ICD-10-CM

## 2013-09-14 DIAGNOSIS — D631 Anemia in chronic kidney disease: Secondary | ICD-10-CM

## 2013-09-14 DIAGNOSIS — D508 Other iron deficiency anemias: Secondary | ICD-10-CM

## 2013-09-14 DIAGNOSIS — K909 Intestinal malabsorption, unspecified: Secondary | ICD-10-CM

## 2013-09-14 DIAGNOSIS — N189 Chronic kidney disease, unspecified: Secondary | ICD-10-CM

## 2013-09-14 HISTORY — DX: Other iron deficiency anemias: D50.8

## 2013-09-14 LAB — IRON AND TIBC CHCC
%SAT: 51 % (ref 21–57)
IRON: 118 ug/dL (ref 41–142)
TIBC: 231 ug/dL — AB (ref 236–444)
UIBC: 113 ug/dL — ABNORMAL LOW (ref 120–384)

## 2013-09-14 LAB — CBC WITH DIFFERENTIAL (CANCER CENTER ONLY)
BASO#: 0 10*3/uL (ref 0.0–0.2)
BASO%: 0.5 % (ref 0.0–2.0)
EOS ABS: 0 10*3/uL (ref 0.0–0.5)
EOS%: 0.5 % (ref 0.0–7.0)
HCT: 32.7 % — ABNORMAL LOW (ref 34.8–46.6)
HGB: 10.9 g/dL — ABNORMAL LOW (ref 11.6–15.9)
LYMPH#: 0.8 10*3/uL — ABNORMAL LOW (ref 0.9–3.3)
LYMPH%: 21.8 % (ref 14.0–48.0)
MCH: 31.3 pg (ref 26.0–34.0)
MCHC: 33.3 g/dL (ref 32.0–36.0)
MCV: 94 fL (ref 81–101)
MONO#: 0.5 10*3/uL (ref 0.1–0.9)
MONO%: 11.7 % (ref 0.0–13.0)
NEUT%: 65.5 % (ref 39.6–80.0)
NEUTROS ABS: 2.5 10*3/uL (ref 1.5–6.5)
PLATELETS: 231 10*3/uL (ref 145–400)
RBC: 3.48 10*6/uL — ABNORMAL LOW (ref 3.70–5.32)
RDW: 12.7 % (ref 11.1–15.7)
WBC: 3.9 10*3/uL (ref 3.9–10.0)

## 2013-09-14 LAB — FERRITIN CHCC: FERRITIN: 324 ng/mL — AB (ref 9–269)

## 2013-09-14 LAB — RETICULOCYTES (CHCC)
ABS RETIC: 52.1 10*3/uL (ref 19.0–186.0)
RBC.: 3.47 MIL/uL — ABNORMAL LOW (ref 3.87–5.11)
Retic Ct Pct: 1.5 % (ref 0.4–2.3)

## 2013-09-14 MED ORDER — DARBEPOETIN ALFA-POLYSORBATE 300 MCG/0.6ML IJ SOLN
INTRAMUSCULAR | Status: AC
  Filled 2013-09-14: qty 0.6

## 2013-09-14 MED ORDER — DARBEPOETIN ALFA-POLYSORBATE 300 MCG/0.6ML IJ SOLN
300.0000 ug | Freq: Once | INTRAMUSCULAR | Status: AC
  Administered 2013-09-14: 300 ug via SUBCUTANEOUS

## 2013-09-14 NOTE — Patient Instructions (Signed)
Darbepoetin Alfa injection What is this medicine? DARBEPOETIN ALFA (dar be POE e tin AL fa) helps your body make more red blood cells. It is used to treat anemia caused by chronic kidney failure and chemotherapy. This medicine may be used for other purposes; ask your health care provider or pharmacist if you have questions. COMMON BRAND NAME(S): Aranesp What should I tell my health care provider before I take this medicine? They need to know if you have any of these conditions: -blood clotting disorders or history of blood clots -cancer patient not on chemotherapy -cystic fibrosis -heart disease, such as angina, heart failure, or a history of a heart attack -hemoglobin level of 12 g/dL or greater -high blood pressure -low levels of folate, iron, or vitamin B12 -seizures -an unusual or allergic reaction to darbepoetin, erythropoietin, albumin, hamster proteins, latex, other medicines, foods, dyes, or preservatives -pregnant or trying to get pregnant -breast-feeding How should I use this medicine? This medicine is for injection into a vein or under the skin. It is usually given by a health care professional in a hospital or clinic setting. If you get this medicine at home, you will be taught how to prepare and give this medicine. Do not shake the solution before you withdraw a dose. Use exactly as directed. Take your medicine at regular intervals. Do not take your medicine more often than directed. It is important that you put your used needles and syringes in a special sharps container. Do not put them in a trash can. If you do not have a sharps container, call your pharmacist or healthcare provider to get one. Talk to your pediatrician regarding the use of this medicine in children. While this medicine may be used in children as young as 1 year for selected conditions, precautions do apply. Overdosage: If you think you have taken too much of this medicine contact a poison control center or  emergency room at once. NOTE: This medicine is only for you. Do not share this medicine with others. What if I miss a dose? If you miss a dose, take it as soon as you can. If it is almost time for your next dose, take only that dose. Do not take double or extra doses. What may interact with this medicine? Do not take this medicine with any of the following medications: -epoetin alfa This list may not describe all possible interactions. Give your health care provider a list of all the medicines, herbs, non-prescription drugs, or dietary supplements you use. Also tell them if you smoke, drink alcohol, or use illegal drugs. Some items may interact with your medicine. What should I watch for while using this medicine? Visit your prescriber or health care professional for regular checks on your progress and for the needed blood tests and blood pressure measurements. It is especially important for the doctor to make sure your hemoglobin level is in the desired range, to limit the risk of potential side effects and to give you the best benefit. Keep all appointments for any recommended tests. Check your blood pressure as directed. Ask your doctor what your blood pressure should be and when you should contact him or her. As your body makes more red blood cells, you may need to take iron, folic acid, or vitamin B supplements. Ask your doctor or health care provider which products are right for you. If you have kidney disease continue dietary restrictions, even though this medication can make you feel better. Talk with your doctor or health   care professional about the foods you eat and the vitamins that you take. What side effects may I notice from receiving this medicine? Side effects that you should report to your doctor or health care professional as soon as possible: -allergic reactions like skin rash, itching or hives, swelling of the face, lips, or tongue -breathing problems -changes in vision -chest  pain -confusion, trouble speaking or understanding -feeling faint or lightheaded, falls -high blood pressure -muscle aches or pains -pain, swelling, warmth in the leg -rapid weight gain -severe headaches -sudden numbness or weakness of the face, arm or leg -trouble walking, dizziness, loss of balance or coordination -seizures (convulsions) -swelling of the ankles, feet, hands -unusually weak or tired Side effects that usually do not require medical attention (report to your doctor or health care professional if they continue or are bothersome): -diarrhea -fever, chills (flu-like symptoms) -headaches -nausea, vomiting -redness, stinging, or swelling at site where injected This list may not describe all possible side effects. Call your doctor for medical advice about side effects. You may report side effects to FDA at 1-800-FDA-1088. Where should I keep my medicine? Keep out of the reach of children. Store in a refrigerator between 2 and 8 degrees C (36 and 46 degrees F). Do not freeze. Do not shake. Throw away any unused portion if using a single-dose vial. Throw away any unused medicine after the expiration date. NOTE: This sheet is a summary. It may not cover all possible information. If you have questions about this medicine, talk to your doctor, pharmacist, or health care provider.  2015, Elsevier/Gold Standard. (2008-01-17 10:23:57)  

## 2013-09-14 NOTE — Progress Notes (Signed)
Hematology and Oncology Follow Up Visit  Miranda Allen [id number] Mar 24, 1960 53 y.o. 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 is back for followup. Last are back in May. She's been doing well. She feels well. She is watching her blood sugars. She has had no problems with these.  She's had no problems bleeding. There's been no nausea vomiting. She's been working without difficulty. She's had no leg swelling. There's been no rashes. She's had no cough. We last saw her, her ferritin was 41. Her R. saturation was 42%.  Medications: Current outpatient prescriptions:aspirin 81 MG tablet, Take 81 mg by mouth daily., Disp: , Rfl: ;  buPROPion (WELLBUTRIN SR) 150 MG 12 hr tablet, Take 150 mg by mouth daily., Disp: , Rfl: ;  clobetasol cream (TEMOVATE) 0.05 %, Apply topically as needed. , Disp: , Rfl: ;  estrogens conjugated, synthetic B, (ENJUVIA) 0.45 MG tablet, Take 0.45 mg by mouth daily., Disp: , Rfl:  FLUoxetine (PROZAC) 40 MG capsule, Take 40 mg by mouth daily. , Disp: , Rfl: ;  FREESTYLE TEST STRIPS test strip, 1 each by Other route 2 (two) times daily. , Disp: , Rfl: ;  gabapentin (NEURONTIN) 300 MG capsule, Take 300 mg by mouth 3 (three) times daily. 1 in AM-  1 in PM  & 2 at BEDTIME, Disp: , Rfl: ;  hydrocortisone 2.5 % cream, Apply topically as needed. , Disp: , Rfl:  insulin aspart (NOVOLOG) 100 UNIT/ML injection, Inject into the skin as needed. Insulin pump, Pt bolus when she eat's, Disp: , Rfl: ;  levothyroxine (SYNTHROID, LEVOTHROID) 75 MCG tablet, Take 75 mcg by mouth daily before breakfast. , Disp: , Rfl: ;  lisinopril (PRINIVIL,ZESTRIL) 20 MG tablet, Take 10 mg by mouth daily. , Disp: , Rfl: ;  simvastatin (ZOCOR) 40 MG tablet, Take 40 mg by mouth every evening., Disp: ,  Rfl:   Allergies:  Allergies  Allergen Reactions  . Penicillins   . Sulfa Antibiotics     Past Medical History, Surgical history, Social history, and Family History were reviewed and updated.  Review of Systems: As above  Physical Exam:  height is 5\' 7"  (1.702 m) and weight is 173 lb (78.472 kg). Her oral temperature is 97.8 F (36.6 C). Her blood pressure is 106/74 and her pulse is 71. Her respiration is 14.   Well-developed and well-nourished white female in no obvious distress. Head and neck exam shows no ocular or oral lesions. Shows no palpable cervical or supraclavicular lymph nodes. Lungs are clear bilaterally. Cardiac exam regular in rhythm with no murmurs rubs or bruits. Abdomen is soft tissues good bowel sounds. There is no fluid wave. There is no palpable liver or spleen tip. Back exam shows no tenderness over the spine ribs or hips. Extremities shows no clubbing cyanosis or edema. She is good range of motion of her joints. Skin exam no rashes.        Lab Results  Component Value Date   WBC 3.9 09/14/2013   HGB 10.9* 09/14/2013   HCT 32.7* 09/14/2013   MCV 94 09/14/2013   PLT 231 09/14/2013     Chemistry      Component Value Date/Time   NA 134* 05/08/2011 0530   K 4.3 05/08/2011 0530  CL 98 05/08/2011 0530   CO2 29 05/08/2011 0530   BUN 22 05/08/2011 0530   CREATININE 0.98 05/08/2011 0530      Component Value Date/Time   CALCIUM 9.9 05/08/2011 0530   ALKPHOS 51 05/08/2011 0530   AST 25 05/08/2011 0530   ALT 28 05/08/2011 0530   BILITOT 0.2* 05/08/2011 0530       Ferritin is 224. Iron saturation 51%. Total iron is 118.  Impression and Plan: [name] is 53 year old white female with diabetes. She is insulin dependent. She has erythropoietin deficiency. She also had transient iron deficiency.  She has responded very nicely. Her blood count is slowly coming down.  We will go ahead and give her a dose of Aranesp today. This, I think, will get her through for the  next couple months.  I will plan to see her back in about 3 months.   Miranda Macho, MD 7/30/20157:08 PM

## 2013-09-15 ENCOUNTER — Telehealth: Payer: Self-pay | Admitting: *Deleted

## 2013-09-15 NOTE — Telephone Encounter (Addendum)
Message copied by Burnett CorrenteUMLEY, Emmerson Shuffield L on Fri Sep 15, 2013  8:38 AM ------      Message from: Arlan OrganENNEVER, PETER R      Created: Thu Sep 14, 2013  6:15 PM       Please call and that her know that the iron is very good. Thanks. Pete ------Informed pt that iron was very good.

## 2013-09-20 ENCOUNTER — Telehealth: Payer: Self-pay | Admitting: Hematology & Oncology

## 2013-09-20 NOTE — Telephone Encounter (Signed)
[employer]  Auth Nbr: [id number] Status: Approved  Dates: 09/14/2013-01/04/2014 CPT: [id number]

## 2013-12-14 ENCOUNTER — Ambulatory Visit: Payer: Managed Care, Other (non HMO)

## 2013-12-14 ENCOUNTER — Ambulatory Visit (HOSPITAL_BASED_OUTPATIENT_CLINIC_OR_DEPARTMENT_OTHER): Payer: Managed Care, Other (non HMO) | Admitting: Hematology & Oncology

## 2013-12-14 ENCOUNTER — Other Ambulatory Visit (HOSPITAL_BASED_OUTPATIENT_CLINIC_OR_DEPARTMENT_OTHER): Payer: Managed Care, Other (non HMO) | Admitting: Lab

## 2013-12-14 VITALS — BP 113/70 | HR 71 | Temp 98.2°F | Resp 16 | Ht 67.0 in | Wt 176.0 lb

## 2013-12-14 DIAGNOSIS — N189 Chronic kidney disease, unspecified: Secondary | ICD-10-CM

## 2013-12-14 DIAGNOSIS — D631 Anemia in chronic kidney disease: Secondary | ICD-10-CM

## 2013-12-14 LAB — IRON AND TIBC CHCC
%SAT: 45 % (ref 21–57)
Iron: 100 ug/dL (ref 41–142)
TIBC: 220 ug/dL — ABNORMAL LOW (ref 236–444)
UIBC: 120 ug/dL (ref 120–384)

## 2013-12-14 LAB — CBC WITH DIFFERENTIAL (CANCER CENTER ONLY)
BASO#: 0 10*3/uL (ref 0.0–0.2)
BASO%: 0.6 % (ref 0.0–2.0)
EOS ABS: 0.1 10*3/uL (ref 0.0–0.5)
EOS%: 2 % (ref 0.0–7.0)
HCT: 33.8 % — ABNORMAL LOW (ref 34.8–46.6)
HEMOGLOBIN: 11.3 g/dL — AB (ref 11.6–15.9)
LYMPH#: 0.9 10*3/uL (ref 0.9–3.3)
LYMPH%: 16.3 % (ref 14.0–48.0)
MCH: 30.7 pg (ref 26.0–34.0)
MCHC: 33.4 g/dL (ref 32.0–36.0)
MCV: 92 fL (ref 81–101)
MONO#: 0.5 10*3/uL (ref 0.1–0.9)
MONO%: 9.6 % (ref 0.0–13.0)
NEUT%: 71.5 % (ref 39.6–80.0)
NEUTROS ABS: 3.9 10*3/uL (ref 1.5–6.5)
Platelets: 234 10*3/uL (ref 145–400)
RBC: 3.68 10*6/uL — AB (ref 3.70–5.32)
RDW: 12.2 % (ref 11.1–15.7)
WBC: 5.4 10*3/uL (ref 3.9–10.0)

## 2013-12-14 LAB — RETICULOCYTES (CHCC)
ABS Retic: 22.3 10*3/uL (ref 19.0–186.0)
RBC.: 3.72 MIL/uL — ABNORMAL LOW (ref 3.87–5.11)
RETIC CT PCT: 0.6 % (ref 0.4–2.3)

## 2013-12-14 LAB — FERRITIN CHCC: FERRITIN: 254 ng/mL (ref 9–269)

## 2013-12-14 NOTE — Progress Notes (Signed)
Hematology and Oncology Follow Up Visit  Miranda Allen [id number] [birth year] 53 y.o. 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 is back for followup. We last saw her back in July. She's been doing well. She feels well. She is now has a new insulin pump. Her blood sugars have been going up and down.   She had a good summer. She was on her motorcycle quite a bit. She really enjoys this.  Her birthday was this past weekend. She had a nice time. She went riding on her motorcycle.  She's had no problems bleeding. There's been no nausea vomiting. She's been working without difficulty. She's had no leg swelling. There's been no rashes. She's had no cough. We last saw her, her ferritin was 324.Marland Kitchen. Her iron saturation was 51%.  Medications: Current outpatient prescriptions:glucose blood test strip, 1 each by Other route as needed for other. Use as instructed, Disp: , Rfl: ;  aspirin 81 MG tablet, Take 81 mg by mouth daily., Disp: , Rfl: ;  buPROPion (WELLBUTRIN SR) 150 MG 12 hr tablet, Take 150 mg by mouth daily., Disp: , Rfl: ;  clobetasol cream (TEMOVATE) 0.05 %, Apply topically as needed. , Disp: , Rfl:  estrogens conjugated, synthetic B, (ENJUVIA) 0.45 MG tablet, Take 0.45 mg by mouth daily., Disp: , Rfl: ;  FLUoxetine (PROZAC) 40 MG capsule, Take 40 mg by mouth daily. , Disp: , Rfl: ;  FREESTYLE TEST STRIPS test strip, 1 each by Other route 2 (two) times daily. , Disp: , Rfl: ;  gabapentin (NEURONTIN) 300 MG capsule, Take 300 mg by mouth 3 (three) times daily. 1 in AM-  1 in PM  & 2 at BEDTIME, Disp: , Rfl:  hydrocortisone 2.5 % cream, Apply topically as needed. , Disp: , Rfl: ;  insulin aspart (NOVOLOG) 100 UNIT/ML injection, Inject into the skin as  needed. Insulin pump, Pt bolus when she eat's, Disp: , Rfl: ;  levothyroxine (SYNTHROID, LEVOTHROID) 75 MCG tablet, Take 75 mcg by mouth daily before breakfast. , Disp: , Rfl: ;  lisinopril (PRINIVIL,ZESTRIL) 20 MG tablet, Take 10 mg by mouth daily. , Disp: , Rfl:  simvastatin (ZOCOR) 40 MG tablet, Take 40 mg by mouth every evening., Disp: , Rfl:   Allergies:  Allergies  Allergen Reactions  . Penicillins   . Sulfa Antibiotics     Past Medical History, Surgical history, Social history, and Family History were reviewed and updated.  Review of Systems: As above  Physical Exam:  height is 5\' 7"  (1.702 m) and weight is 176 lb (79.833 kg). Her oral temperature is 98.2 F (36.8 C). Her blood pressure is 113/70 and her pulse is 71. Her respiration is 16.   Well-developed and well-nourished white female in no obvious distress. Head and neck exam shows no ocular or oral lesions. Shows no palpable cervical or supraclavicular lymph nodes. Lungs are clear bilaterally. Cardiac exam regular in rhythm with no murmurs rubs or bruits. Abdomen is soft tissues good bowel sounds. There is no fluid wave. There is no palpable liver or spleen tip. Back exam shows no tenderness over the spine ribs or hips. Extremities shows no clubbing cyanosis or edema. She is good range of motion of her joints. Skin  exam no rashes.        Lab Results  Component Value Date   WBC 5.4 12/14/2013   HGB 11.3* 12/14/2013   HCT 33.8* 12/14/2013   MCV 92 12/14/2013   PLT 234 12/14/2013     Chemistry      Component Value Date/Time   NA 134* 05/08/2011 0530   K 4.3 05/08/2011 0530   CL 98 05/08/2011 0530   CO2 29 05/08/2011 0530   BUN 22 05/08/2011 0530   CREATININE 0.98 05/08/2011 0530      Component Value Date/Time   CALCIUM 9.9 05/08/2011 0530   ALKPHOS 51 05/08/2011 0530   AST 25 05/08/2011 0530   ALT 28 05/08/2011 0530   BILITOT 0.2* 05/08/2011 0530       Ferritin is 254. Iron saturation 45 %. Total iron is  100  Impression and Plan: [name] is 53 year old white female with diabetes. She is insulin dependent. She has erythropoietin deficiency. She also had transient iron deficiency.  She has responded very nicely. Her blood count is actually doing quite well. It is better than we last saw her. We gave her a dose of Aranesp back in July.  I will plan to see her back in about 6 months as she is doing so well.Josph Macho.   Fortune Torosian R, MD 10/29/20157:01 PM

## 2013-12-15 ENCOUNTER — Telehealth: Payer: Self-pay | Admitting: *Deleted

## 2013-12-15 NOTE — Telephone Encounter (Addendum)
Message given to patient.   Message copied by Gwendel HansonRACY, JAMIE E on Fri Dec 15, 2013  4:37 PM ------      Message from: Josph MachoENNEVER, PETER R      Created: Fri Dec 15, 2013  7:46 AM       Please call and let her know that the iron levels are fantastic. [pete ------

## 2014-05-10 ENCOUNTER — Other Ambulatory Visit: Payer: Self-pay | Admitting: Family

## 2014-06-14 ENCOUNTER — Encounter: Payer: Self-pay | Admitting: Hematology & Oncology

## 2014-06-14 ENCOUNTER — Ambulatory Visit (HOSPITAL_BASED_OUTPATIENT_CLINIC_OR_DEPARTMENT_OTHER): Payer: Managed Care, Other (non HMO) | Admitting: Hematology & Oncology

## 2014-06-14 ENCOUNTER — Ambulatory Visit (HOSPITAL_BASED_OUTPATIENT_CLINIC_OR_DEPARTMENT_OTHER): Payer: Managed Care, Other (non HMO)

## 2014-06-14 ENCOUNTER — Other Ambulatory Visit (HOSPITAL_BASED_OUTPATIENT_CLINIC_OR_DEPARTMENT_OTHER): Payer: Managed Care, Other (non HMO)

## 2014-06-14 ENCOUNTER — Telehealth: Payer: Self-pay | Admitting: Hematology & Oncology

## 2014-06-14 VITALS — BP 103/62 | HR 70 | Temp 98.0°F | Resp 14 | Ht 67.0 in | Wt 180.0 lb

## 2014-06-14 DIAGNOSIS — N189 Chronic kidney disease, unspecified: Secondary | ICD-10-CM | POA: Diagnosis not present

## 2014-06-14 DIAGNOSIS — D508 Other iron deficiency anemias: Secondary | ICD-10-CM

## 2014-06-14 DIAGNOSIS — D631 Anemia in chronic kidney disease: Secondary | ICD-10-CM

## 2014-06-14 DIAGNOSIS — E119 Type 2 diabetes mellitus without complications: Secondary | ICD-10-CM

## 2014-06-14 LAB — CMP (CANCER CENTER ONLY)
ALT(SGPT): 20 U/L (ref 10–47)
AST: 22 U/L (ref 11–38)
Albumin: 3.4 g/dL (ref 3.3–5.5)
Alkaline Phosphatase: 52 U/L (ref 26–84)
BUN: 17 mg/dL (ref 7–22)
CALCIUM: 9.4 mg/dL (ref 8.0–10.3)
CHLORIDE: 102 meq/L (ref 98–108)
CO2: 30 meq/L (ref 18–33)
Creat: 1.2 mg/dl (ref 0.6–1.2)
Glucose, Bld: 133 mg/dL — ABNORMAL HIGH (ref 73–118)
Potassium: 4.5 mEq/L (ref 3.3–4.7)
Sodium: 143 mEq/L (ref 128–145)
Total Bilirubin: 0.4 mg/dl (ref 0.20–1.60)
Total Protein: 6.6 g/dL (ref 6.4–8.1)

## 2014-06-14 LAB — CBC WITH DIFFERENTIAL (CANCER CENTER ONLY)
BASO#: 0 10*3/uL (ref 0.0–0.2)
BASO%: 0.2 % (ref 0.0–2.0)
EOS%: 3.9 % (ref 0.0–7.0)
Eosinophils Absolute: 0.2 10*3/uL (ref 0.0–0.5)
HEMATOCRIT: 30.5 % — AB (ref 34.8–46.6)
HGB: 10.1 g/dL — ABNORMAL LOW (ref 11.6–15.9)
LYMPH#: 0.6 10*3/uL — ABNORMAL LOW (ref 0.9–3.3)
LYMPH%: 12 % — ABNORMAL LOW (ref 14.0–48.0)
MCH: 30.4 pg (ref 26.0–34.0)
MCHC: 33.1 g/dL (ref 32.0–36.0)
MCV: 92 fL (ref 81–101)
MONO#: 0.5 10*3/uL (ref 0.1–0.9)
MONO%: 10.3 % (ref 0.0–13.0)
NEUT#: 3.8 10*3/uL (ref 1.5–6.5)
NEUT%: 73.6 % (ref 39.6–80.0)
Platelets: 215 10*3/uL (ref 145–400)
RBC: 3.32 10*6/uL — ABNORMAL LOW (ref 3.70–5.32)
RDW: 12.3 % (ref 11.1–15.7)
WBC: 5.2 10*3/uL (ref 3.9–10.0)

## 2014-06-14 LAB — IRON AND TIBC CHCC
%SAT: 9 % — ABNORMAL LOW (ref 21–57)
IRON: 21 ug/dL — AB (ref 41–142)
TIBC: 235 ug/dL — ABNORMAL LOW (ref 236–444)
UIBC: 214 ug/dL (ref 120–384)

## 2014-06-14 LAB — FERRITIN CHCC: Ferritin: 257 ng/ml (ref 9–269)

## 2014-06-14 MED ORDER — DARBEPOETIN ALFA 300 MCG/0.6ML IJ SOSY
300.0000 ug | PREFILLED_SYRINGE | Freq: Once | INTRAMUSCULAR | Status: AC
  Administered 2014-06-14: 300 ug via SUBCUTANEOUS

## 2014-06-14 MED ORDER — DARBEPOETIN ALFA 300 MCG/0.6ML IJ SOSY
PREFILLED_SYRINGE | INTRAMUSCULAR | Status: AC
  Filled 2014-06-14: qty 0.6

## 2014-06-14 NOTE — Progress Notes (Signed)
Hematology and Oncology Follow Up Visit  Miranda Allen [id number] 10/12/1960 54 y.o. 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 We last saw her back in October. She's been doing well. She now has a new job. She is enjoying this. She got this back in November.  She's had no problems with bleeding.  She has had no issues with her diabetes. She is on insulin.  She's had no change in bowel or bladder habits.  She's had no rashes.  His been no cough or shortness of breath. gh. When we last saw her, her ferritin was 254 Her iron saturation was 45 %.  Medications:  Current outpatient prescriptions:  .  aspirin 81 MG tablet, Take 81 mg by mouth daily., Disp: , Rfl:  .  buPROPion (WELLBUTRIN SR) 150 MG 12 hr tablet, Take 150 mg by mouth daily., Disp: , Rfl:  .  clobetasol cream (TEMOVATE) 0.05 %, Apply topically as needed. , Disp: , Rfl:  .  estrogens conjugated, synthetic B, (ENJUVIA) 0.45 MG tablet, Take 0.45 mg by mouth daily., Disp: , Rfl:  .  FLUoxetine (PROZAC) 40 MG capsule, Take 40 mg by mouth daily. , Disp: , Rfl:  .  FREESTYLE TEST STRIPS test strip, 1 each by Other route 2 (two) times daily. , Disp: , Rfl:  .  gabapentin (NEURONTIN) 300 MG capsule, Take 300 mg by mouth 3 (three) times daily. 1 in AM-  1 in PM  & 2 at BEDTIME, Disp: , Rfl:  .  glucose blood test strip, 1 each by Other route as needed for other. Use as instructed, Disp: , Rfl:  .  hydrocortisone 2.5 % cream, Apply topically as needed. , Disp: , Rfl:  .  insulin aspart (NOVOLOG) 100 UNIT/ML injection, Inject into the skin as needed. Insulin pump, Pt bolus when she eat's, Disp: , Rfl:  .  levothyroxine (SYNTHROID, LEVOTHROID) 75 MCG tablet, Take 75  mcg by mouth daily before breakfast. , Disp: , Rfl:  .  lisinopril (PRINIVIL,ZESTRIL) 20 MG tablet, Take 10 mg by mouth daily. , Disp: , Rfl:  .  simvastatin (ZOCOR) 40 MG tablet, Take 40 mg by mouth every evening., Disp: , Rfl:   Allergies:  Allergies  Allergen Reactions  . Penicillins   . Sulfa Antibiotics     Past Medical History, Surgical history, Social history, and Family History were reviewed and updated.  Review of Systems: As above  Physical Exam:  height is  (1.702 m) and weight is 180 lb (81.647 kg). Her oral temperature is 98 F (36.7 C). Her blood pressure is 103/62 and her pulse is 70. Her respiration is 14.   Well-developed and well-nourished white female in no obvious distress. Head and neck exam shows no ocular or oral lesions. Shows no palpable cervical or supraclavicular lymph nodes. Lungs are clear bilaterally. Cardiac exam regular in rhythm with no murmurs rubs or bruits. Abdomen is soft tissues good bowel sounds. There is no fluid wave. There is no palpable liver or spleen tip. Back exam shows no tenderness over the spine ribs or hips. Extremities shows no clubbing cyanosis or edema. She is good range of motion of her joints. Skin exam no rashes.  Lab Results  Component Value Date   WBC 5.2 06/14/2014   HGB 10.1* 06/14/2014   HCT 30.5* 06/14/2014   MCV 92 06/14/2014   PLT 215 06/14/2014     Chemistry      Component Value Date/Time   NA 143 06/14/2014 0754   NA 134* 05/08/2011 0530   K 4.5 06/14/2014 0754   K 4.3 05/08/2011 0530   CL 102 06/14/2014 0754   CL 98 05/08/2011 0530   CO2 30 06/14/2014 0754   CO2 29 05/08/2011 0530   BUN 17 06/14/2014 0754   BUN 22 05/08/2011 0530   CREATININE 1.2 06/14/2014 0754   CREATININE 0.98 05/08/2011 0530      Component Value Date/Time   CALCIUM 9.4 06/14/2014 0754   CALCIUM 9.9 05/08/2011 0530   ALKPHOS 52 06/14/2014 0754   ALKPHOS 51 05/08/2011 0530   AST 22 06/14/2014 0754   AST 25  05/08/2011 0530   ALT 20 06/14/2014 0754   ALT 28 05/08/2011 0530   BILITOT 0.40 06/14/2014 0754   BILITOT 0.2* 05/08/2011 0530         Impression and Plan: Miranda Allen is 54 year old white female with diabetes. She is insulin dependent. She has erythropoietin deficiency. She also had transient iron deficiency.  Her hemoglobin is dropping. As such, we will go ahead and give her a dose of Aranesp today. This always works for her.  I would be surprised if her iron studies are low.  We will plan to see her back in 6 months.  Josph MachoENNEVER,Krysti Hickling R, MD 4/28/20168:40 AM

## 2014-06-14 NOTE — Telephone Encounter (Signed)
[employer]  Auth Nbr: [id number] Status: APPROVED Dates: 06/14/2014 - 10/04/2014 CPT: [id number]  Total: 16 weeks   P: [phone] F: [phone]

## 2014-06-14 NOTE — Patient Instructions (Signed)
Darbepoetin Alfa injection What is this medicine? DARBEPOETIN ALFA (dar be POE e tin AL fa) helps your body make more red blood cells. It is used to treat anemia caused by chronic kidney failure and chemotherapy. This medicine may be used for other purposes; ask your health care provider or pharmacist if you have questions. COMMON BRAND NAME(S): Aranesp What should I tell my health care provider before I take this medicine? They need to know if you have any of these conditions: -blood clotting disorders or history of blood clots -cancer patient not on chemotherapy -cystic fibrosis -heart disease, such as angina, heart failure, or a history of a heart attack -hemoglobin level of 12 g/dL or greater -high blood pressure -low levels of folate, iron, or vitamin B12 -seizures -an unusual or allergic reaction to darbepoetin, erythropoietin, albumin, hamster proteins, latex, other medicines, foods, dyes, or preservatives -pregnant or trying to get pregnant -breast-feeding How should I use this medicine? This medicine is for injection into a vein or under the skin. It is usually given by a health care professional in a hospital or clinic setting. If you get this medicine at home, you will be taught how to prepare and give this medicine. Do not shake the solution before you withdraw a dose. Use exactly as directed. Take your medicine at regular intervals. Do not take your medicine more often than directed. It is important that you put your used needles and syringes in a special sharps container. Do not put them in a trash can. If you do not have a sharps container, call your pharmacist or healthcare provider to get one. Talk to your pediatrician regarding the use of this medicine in children. While this medicine may be used in children as young as 1 year for selected conditions, precautions do apply. Overdosage: If you think you have taken too much of this medicine contact a poison control center or  emergency room at once. NOTE: This medicine is only for you. Do not share this medicine with others. What if I miss a dose? If you miss a dose, take it as soon as you can. If it is almost time for your next dose, take only that dose. Do not take double or extra doses. What may interact with this medicine? Do not take this medicine with any of the following medications: -epoetin alfa This list may not describe all possible interactions. Give your health care provider a list of all the medicines, herbs, non-prescription drugs, or dietary supplements you use. Also tell them if you smoke, drink alcohol, or use illegal drugs. Some items may interact with your medicine. What should I watch for while using this medicine? Visit your prescriber or health care professional for regular checks on your progress and for the needed blood tests and blood pressure measurements. It is especially important for the doctor to make sure your hemoglobin level is in the desired range, to limit the risk of potential side effects and to give you the best benefit. Keep all appointments for any recommended tests. Check your blood pressure as directed. Ask your doctor what your blood pressure should be and when you should contact him or her. As your body makes more red blood cells, you may need to take iron, folic acid, or vitamin B supplements. Ask your doctor or health care provider which products are right for you. If you have kidney disease continue dietary restrictions, even though this medication can make you feel better. Talk with your doctor or health   care professional about the foods you eat and the vitamins that you take. What side effects may I notice from receiving this medicine? Side effects that you should report to your doctor or health care professional as soon as possible: -allergic reactions like skin rash, itching or hives, swelling of the face, lips, or tongue -breathing problems -changes in vision -chest  pain -confusion, trouble speaking or understanding -feeling faint or lightheaded, falls -high blood pressure -muscle aches or pains -pain, swelling, warmth in the leg -rapid weight gain -severe headaches -sudden numbness or weakness of the face, arm or leg -trouble walking, dizziness, loss of balance or coordination -seizures (convulsions) -swelling of the ankles, feet, hands -unusually weak or tired Side effects that usually do not require medical attention (report to your doctor or health care professional if they continue or are bothersome): -diarrhea -fever, chills (flu-like symptoms) -headaches -nausea, vomiting -redness, stinging, or swelling at site where injected This list may not describe all possible side effects. Call your doctor for medical advice about side effects. You may report side effects to FDA at 1-800-FDA-1088. Where should I keep my medicine? Keep out of the reach of children. Store in a refrigerator between 2 and 8 degrees C (36 and 46 degrees F). Do not freeze. Do not shake. Throw away any unused portion if using a single-dose vial. Throw away any unused medicine after the expiration date. NOTE: This sheet is a summary. It may not cover all possible information. If you have questions about this medicine, talk to your doctor, pharmacist, or health care provider.  2015, Elsevier/Gold Standard. (2008-01-17 10:23:57)  

## 2014-06-18 ENCOUNTER — Other Ambulatory Visit: Payer: Self-pay | Admitting: *Deleted

## 2014-06-18 ENCOUNTER — Telehealth: Payer: Self-pay | Admitting: *Deleted

## 2014-06-18 DIAGNOSIS — D509 Iron deficiency anemia, unspecified: Secondary | ICD-10-CM

## 2014-06-18 NOTE — Telephone Encounter (Signed)
-----   Message from Josph MachoPeter R Ennever, MD sent at 06/14/2014  6:12 PM EDT ----- Call - the iron is actuallly low!!!  Need 1 dose of Feraheme!!!  Please set up for 1-2 weeks!!!  pete

## 2014-06-22 ENCOUNTER — Ambulatory Visit (HOSPITAL_BASED_OUTPATIENT_CLINIC_OR_DEPARTMENT_OTHER): Payer: Managed Care, Other (non HMO)

## 2014-06-22 VITALS — BP 116/66 | HR 68 | Temp 98.3°F | Resp 16 | Ht 67.0 in | Wt 180.0 lb

## 2014-06-22 DIAGNOSIS — D509 Iron deficiency anemia, unspecified: Secondary | ICD-10-CM | POA: Diagnosis not present

## 2014-06-22 DIAGNOSIS — N189 Chronic kidney disease, unspecified: Secondary | ICD-10-CM

## 2014-06-22 DIAGNOSIS — K909 Intestinal malabsorption, unspecified: Secondary | ICD-10-CM | POA: Diagnosis not present

## 2014-06-22 MED ORDER — SODIUM CHLORIDE 0.9 % IV SOLN
510.0000 mg | Freq: Once | INTRAVENOUS | Status: AC
  Administered 2014-06-22: 510 mg via INTRAVENOUS
  Filled 2014-06-22: qty 17

## 2014-06-22 MED ORDER — SODIUM CHLORIDE 0.9 % IV SOLN
Freq: Once | INTRAVENOUS | Status: AC
  Administered 2014-06-22: 09:00:00 via INTRAVENOUS

## 2014-06-22 NOTE — Patient Instructions (Signed)

## 2014-09-03 IMAGING — US US SOFT TISSUE HEAD/NECK
1 series · 13 of 25 positions shown · non-contrast
Comparison: US SOFT TISSUE HEAD/NECK dated 06/10/2012; US THYROID
BIOPSY dated 06/22/2012; US THYROID BIOPSY dated 06/22/2012

CLINICAL DATA: History of multinodular thyroid goiter with previous
biopsy of dominant right and left thyroid nodules in June 2012.

EXAM:
THYROID ULTRASOUND
TECHNIQUE: Ultrasound examination of the thyroid gland and adjacent soft
tissues was performed.

[Series 1: us soft tissue head/neck · 0.14mm/px · 13 of 60 slices shown]
[im 1/60]
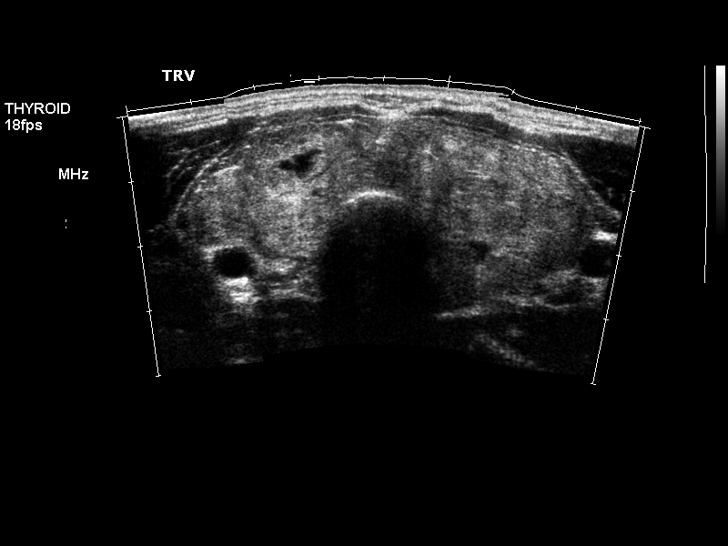
[im 5/60]
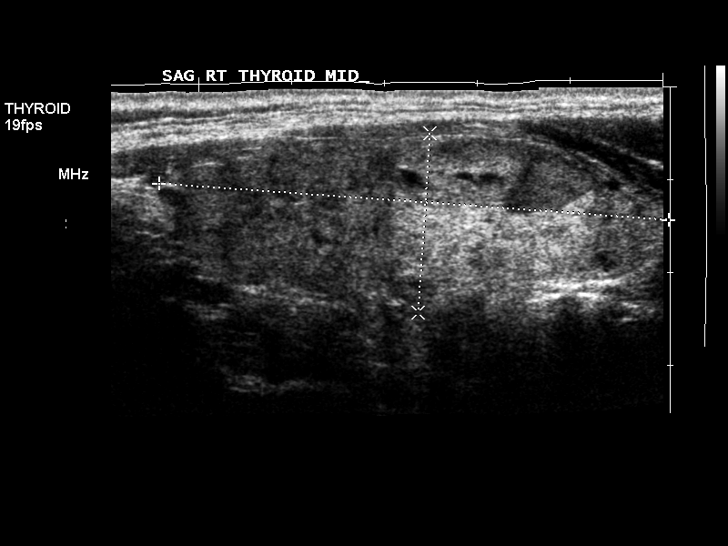
[im 10/60]
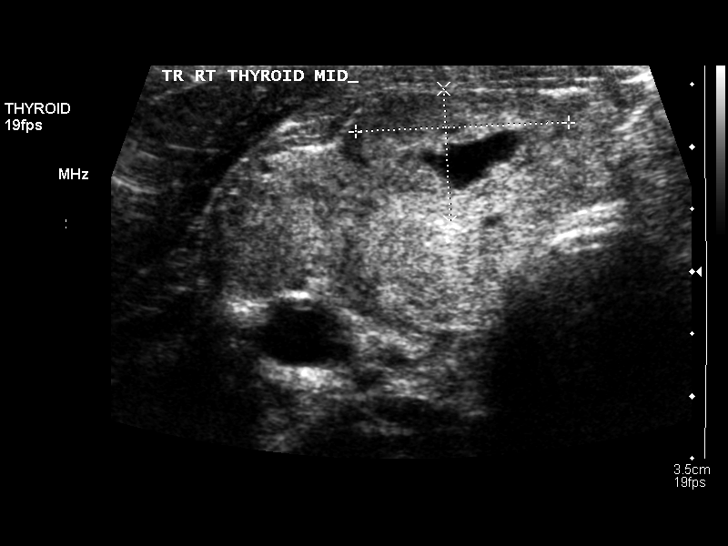
[im 15/60]
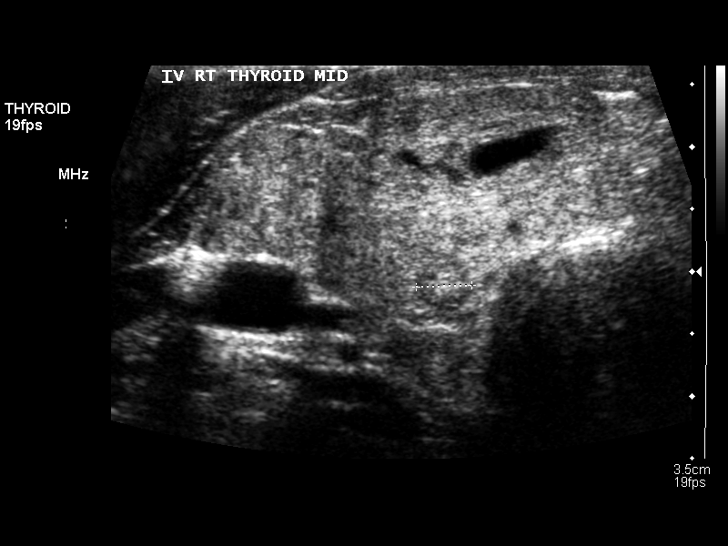
[im 20/60]
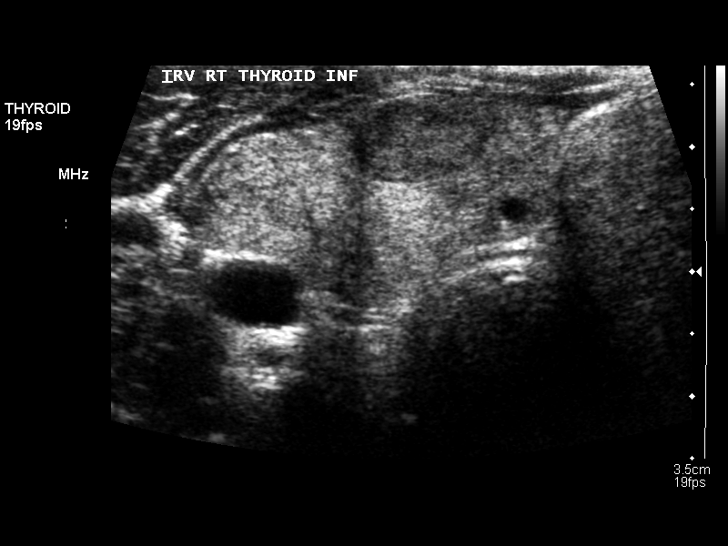
[im 25/60]
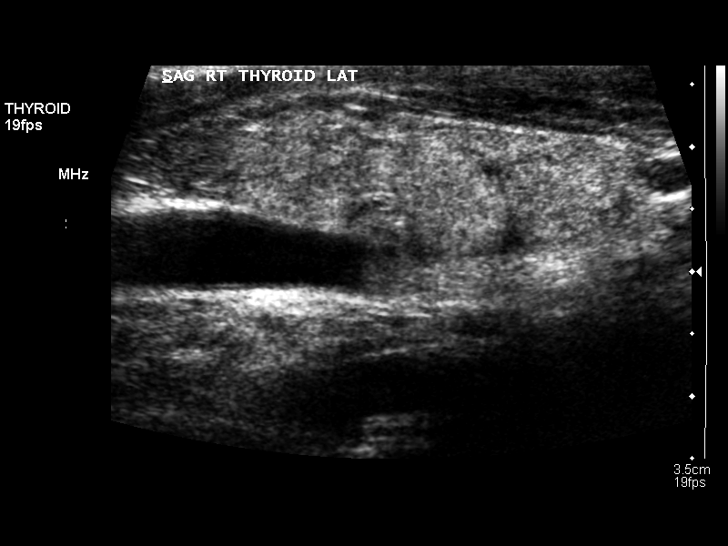
[im 30/60]
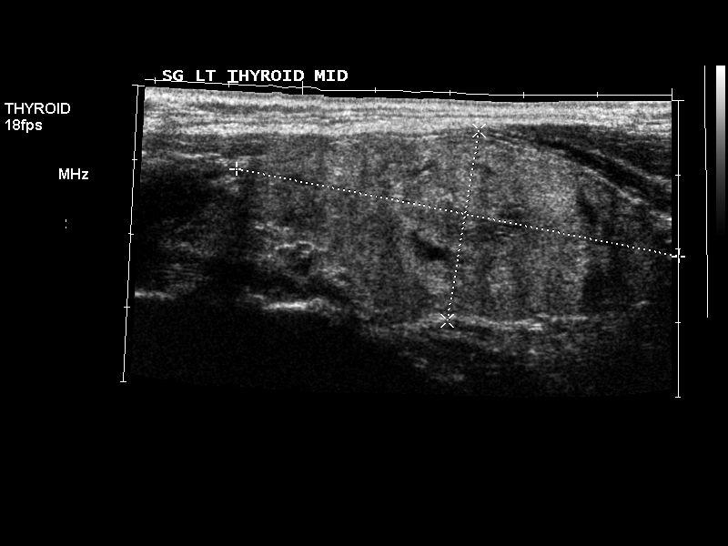
[im 35/60]
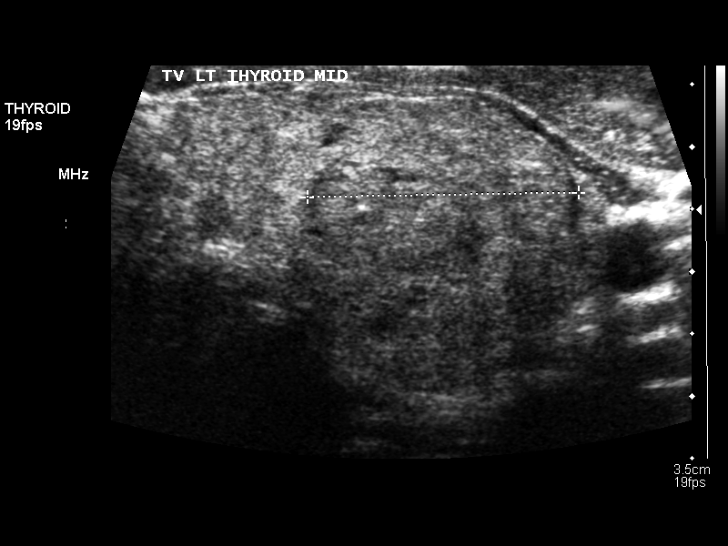
[im 40/60]
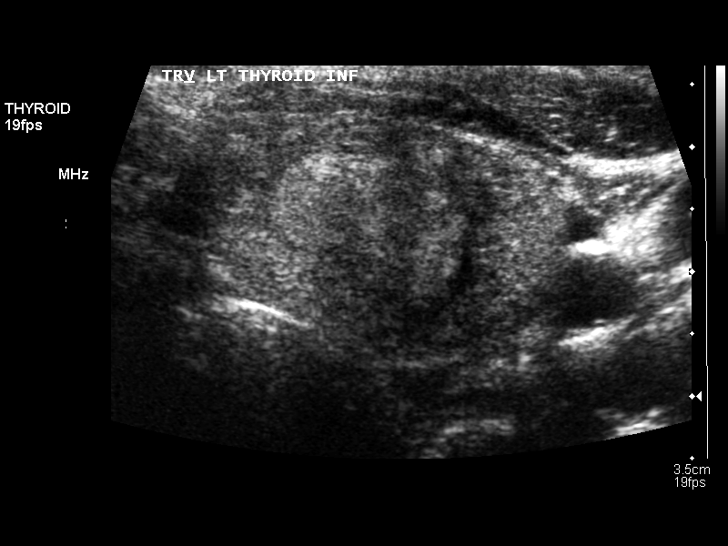
[im 45/60]
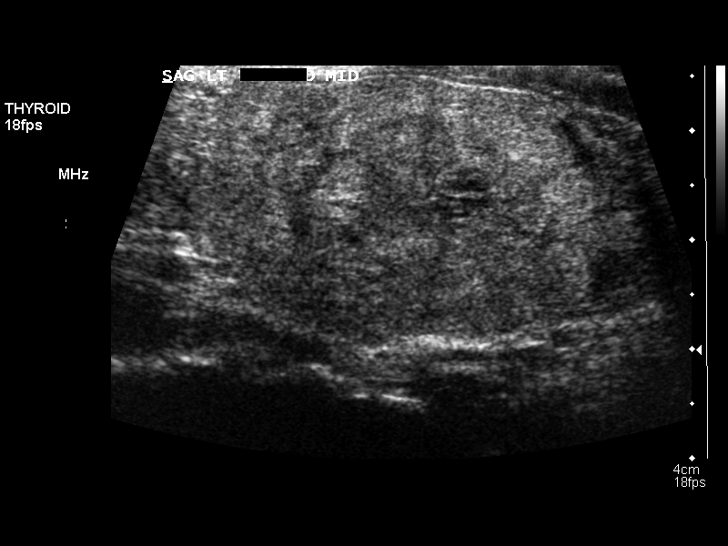
[im 50/60]
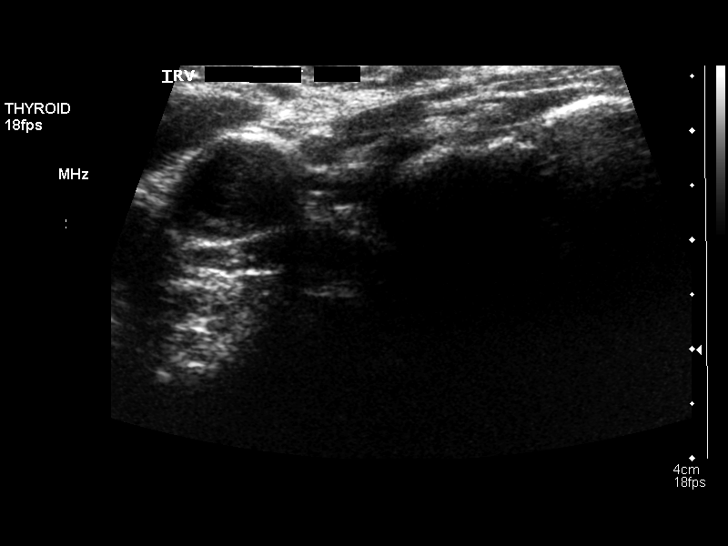
[im 55/60]
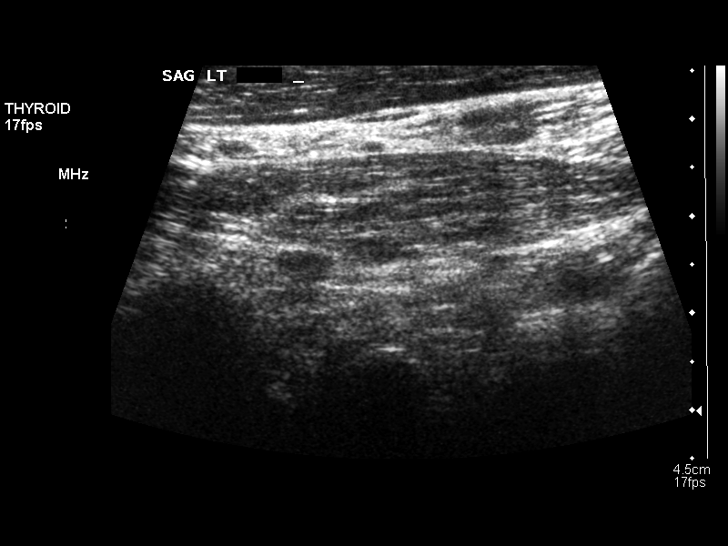
[im 60/60]
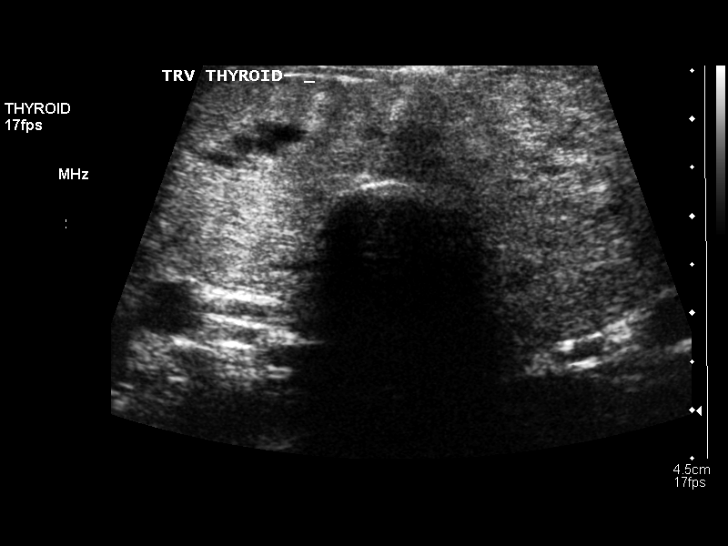

[13 of 25 positions shown; findings below may reference images not displayed]

FINDINGS: Right thyroid lobe

Measurements: 5.5 x 1.9 x 2.5 cm. Dominant partially solid and
partially cystic nodule in the right lobe shows slight retraction in
size due to decreased size of the central cystic component.
Estimated dimensions of the nodule today are 1.7 x 1.1 x 2.1 cm.
Additional 1.1 cm solid nodule in the inferior right lobe has a
stable appearance.

Left thyroid lobe

Measurements: 6.1 x 2.6 x 3.2 cm. The dominant solid nodule in the
left lobe shows slightly smaller size and stable morphology with
current estimated dimensions of approximately 2.2 x 3.1 x 2.2 cm.
Previous dimensions were approximately 3.4 x 2.4 x 3.2 cm.

Isthmus

Thickness: 1.0 cm.  No nodules visualized.

Lymphadenopathy

None visualized.
IMPRESSION: Slightly smaller dimensions of dominant bilateral thyroid nodules,
as above. No new nodules are identified.

## 2014-11-28 ENCOUNTER — Telehealth: Payer: Self-pay | Admitting: Hematology & Oncology

## 2014-11-28 NOTE — Telephone Encounter (Signed)
MD will be out of office 12/12/14.  Patient appt was cx.  Patient was called and she resch appt for 12/20/14

## 2014-12-12 ENCOUNTER — Other Ambulatory Visit: Payer: Managed Care, Other (non HMO)

## 2014-12-12 ENCOUNTER — Ambulatory Visit: Payer: Managed Care, Other (non HMO) | Admitting: Hematology & Oncology

## 2014-12-12 ENCOUNTER — Ambulatory Visit: Payer: Managed Care, Other (non HMO)

## 2014-12-19 ENCOUNTER — Ambulatory Visit: Payer: Managed Care, Other (non HMO)

## 2014-12-19 ENCOUNTER — Other Ambulatory Visit: Payer: Managed Care, Other (non HMO)

## 2014-12-19 ENCOUNTER — Ambulatory Visit: Payer: Managed Care, Other (non HMO) | Admitting: Hematology & Oncology

## 2014-12-20 ENCOUNTER — Ambulatory Visit (HOSPITAL_BASED_OUTPATIENT_CLINIC_OR_DEPARTMENT_OTHER): Payer: Managed Care, Other (non HMO)

## 2014-12-20 ENCOUNTER — Other Ambulatory Visit (HOSPITAL_BASED_OUTPATIENT_CLINIC_OR_DEPARTMENT_OTHER): Payer: Managed Care, Other (non HMO)

## 2014-12-20 ENCOUNTER — Ambulatory Visit (HOSPITAL_BASED_OUTPATIENT_CLINIC_OR_DEPARTMENT_OTHER): Payer: Managed Care, Other (non HMO) | Admitting: Hematology & Oncology

## 2014-12-20 ENCOUNTER — Encounter: Payer: Self-pay | Admitting: Hematology & Oncology

## 2014-12-20 VITALS — BP 128/70 | HR 73 | Temp 97.9°F | Resp 16 | Ht 67.0 in | Wt 182.0 lb

## 2014-12-20 DIAGNOSIS — D631 Anemia in chronic kidney disease: Secondary | ICD-10-CM

## 2014-12-20 DIAGNOSIS — K909 Intestinal malabsorption, unspecified: Secondary | ICD-10-CM

## 2014-12-20 DIAGNOSIS — N189 Chronic kidney disease, unspecified: Secondary | ICD-10-CM

## 2014-12-20 DIAGNOSIS — D508 Other iron deficiency anemias: Secondary | ICD-10-CM | POA: Diagnosis not present

## 2014-12-20 DIAGNOSIS — R718 Other abnormality of red blood cells: Secondary | ICD-10-CM

## 2014-12-20 DIAGNOSIS — E119 Type 2 diabetes mellitus without complications: Secondary | ICD-10-CM

## 2014-12-20 LAB — IRON AND TIBC CHCC
%SAT: 46 % (ref 21–57)
Iron: 105 ug/dL (ref 41–142)
TIBC: 229 ug/dL — AB (ref 236–444)
UIBC: 124 ug/dL (ref 120–384)

## 2014-12-20 LAB — CBC WITH DIFFERENTIAL (CANCER CENTER ONLY)
BASO#: 0 10*3/uL (ref 0.0–0.2)
BASO%: 0.2 % (ref 0.0–2.0)
EOS ABS: 0.1 10*3/uL (ref 0.0–0.5)
EOS%: 0.9 % (ref 0.0–7.0)
HCT: 33.2 % — ABNORMAL LOW (ref 34.8–46.6)
HEMOGLOBIN: 10.7 g/dL — AB (ref 11.6–15.9)
LYMPH#: 0.8 10*3/uL — ABNORMAL LOW (ref 0.9–3.3)
LYMPH%: 13.9 % — AB (ref 14.0–48.0)
MCH: 30.9 pg (ref 26.0–34.0)
MCHC: 32.2 g/dL (ref 32.0–36.0)
MCV: 96 fL (ref 81–101)
MONO#: 0.5 10*3/uL (ref 0.1–0.9)
MONO%: 9.2 % (ref 0.0–13.0)
NEUT%: 75.8 % (ref 39.6–80.0)
NEUTROS ABS: 4.3 10*3/uL (ref 1.5–6.5)
Platelets: 225 10*3/uL (ref 145–400)
RBC: 3.46 10*6/uL — ABNORMAL LOW (ref 3.70–5.32)
RDW: 11.9 % (ref 11.1–15.7)
WBC: 5.7 10*3/uL (ref 3.9–10.0)

## 2014-12-20 LAB — RETICULOCYTES (CHCC)
ABS Retic: 27.7 10*3/uL (ref 19.0–186.0)
RBC.: 3.46 MIL/uL — AB (ref 3.87–5.11)
Retic Ct Pct: 0.8 % (ref 0.4–2.3)

## 2014-12-20 LAB — FERRITIN CHCC: Ferritin: 323 ng/ml — ABNORMAL HIGH (ref 9–269)

## 2014-12-20 MED ORDER — DARBEPOETIN ALFA 300 MCG/0.6ML IJ SOSY
300.0000 ug | PREFILLED_SYRINGE | Freq: Once | INTRAMUSCULAR | Status: AC
  Administered 2014-12-20: 300 ug via SUBCUTANEOUS

## 2014-12-20 MED ORDER — DARBEPOETIN ALFA 300 MCG/0.6ML IJ SOSY
PREFILLED_SYRINGE | INTRAMUSCULAR | Status: AC
  Filled 2014-12-20: qty 0.6

## 2014-12-20 NOTE — Patient Instructions (Signed)
Darbepoetin Alfa injection What is this medicine? DARBEPOETIN ALFA (dar be POE e tin AL fa) helps your body make more red blood cells. It is used to treat anemia caused by chronic kidney failure and chemotherapy. This medicine may be used for other purposes; ask your health care provider or pharmacist if you have questions. COMMON BRAND NAME(S): Aranesp What should I tell my health care provider before I take this medicine? They need to know if you have any of these conditions: -blood clotting disorders or history of blood clots -cancer patient not on chemotherapy -cystic fibrosis -heart disease, such as angina, heart failure, or a history of a heart attack -hemoglobin level of 12 g/dL or greater -high blood pressure -low levels of folate, iron, or vitamin B12 -seizures -an unusual or allergic reaction to darbepoetin, erythropoietin, albumin, hamster proteins, latex, other medicines, foods, dyes, or preservatives -pregnant or trying to get pregnant -breast-feeding How should I use this medicine? This medicine is for injection into a vein or under the skin. It is usually given by a health care professional in a hospital or clinic setting. If you get this medicine at home, you will be taught how to prepare and give this medicine. Do not shake the solution before you withdraw a dose. Use exactly as directed. Take your medicine at regular intervals. Do not take your medicine more often than directed. It is important that you put your used needles and syringes in a special sharps container. Do not put them in a trash can. If you do not have a sharps container, call your pharmacist or healthcare provider to get one. Talk to your pediatrician regarding the use of this medicine in children. While this medicine may be used in children as young as 1 year for selected conditions, precautions do apply. Overdosage: If you think you have taken too much of this medicine contact a poison control center or  emergency room at once. NOTE: This medicine is only for you. Do not share this medicine with others. What if I miss a dose? If you miss a dose, take it as soon as you can. If it is almost time for your next dose, take only that dose. Do not take double or extra doses. What may interact with this medicine? Do not take this medicine with any of the following medications: -epoetin alfa This list may not describe all possible interactions. Give your health care provider a list of all the medicines, herbs, non-prescription drugs, or dietary supplements you use. Also tell them if you smoke, drink alcohol, or use illegal drugs. Some items may interact with your medicine. What should I watch for while using this medicine? Visit your prescriber or health care professional for regular checks on your progress and for the needed blood tests and blood pressure measurements. It is especially important for the doctor to make sure your hemoglobin level is in the desired range, to limit the risk of potential side effects and to give you the best benefit. Keep all appointments for any recommended tests. Check your blood pressure as directed. Ask your doctor what your blood pressure should be and when you should contact him or her. As your body makes more red blood cells, you may need to take iron, folic acid, or vitamin B supplements. Ask your doctor or health care provider which products are right for you. If you have kidney disease continue dietary restrictions, even though this medication can make you feel better. Talk with your doctor or health   care professional about the foods you eat and the vitamins that you take. What side effects may I notice from receiving this medicine? Side effects that you should report to your doctor or health care professional as soon as possible: -allergic reactions like skin rash, itching or hives, swelling of the face, lips, or tongue -breathing problems -changes in vision -chest  pain -confusion, trouble speaking or understanding -feeling faint or lightheaded, falls -high blood pressure -muscle aches or pains -pain, swelling, warmth in the leg -rapid weight gain -severe headaches -sudden numbness or weakness of the face, arm or leg -trouble walking, dizziness, loss of balance or coordination -seizures (convulsions) -swelling of the ankles, feet, hands -unusually weak or tired Side effects that usually do not require medical attention (report to your doctor or health care professional if they continue or are bothersome): -diarrhea -fever, chills (flu-like symptoms) -headaches -nausea, vomiting -redness, stinging, or swelling at site where injected This list may not describe all possible side effects. Call your doctor for medical advice about side effects. You may report side effects to FDA at 1-800-FDA-1088. Where should I keep my medicine? Keep out of the reach of children. Store in a refrigerator between 2 and 8 degrees C (36 and 46 degrees F). Do not freeze. Do not shake. Throw away any unused portion if using a single-dose vial. Throw away any unused medicine after the expiration date. NOTE: This sheet is a summary. It may not cover all possible information. If you have questions about this medicine, talk to your doctor, pharmacist, or health care provider.  2015, Elsevier/Gold Standard. (2008-01-17 10:23:57)  

## 2014-12-20 NOTE — Progress Notes (Signed)
Hematology and Oncology Follow Up Visit  Miranda Allen [id number] Nov 03, 1960 54 y.o. 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 We last saw her back in April. She had a good summer. She is working quite a bit. Her husband is traveling quite a bit with his job. She rides a [employer]. She did not ride as much as she would like this summer because the heat. She has been riding with her husband up into the mountains and down the beach this fall.  Her blood sugars have been doing okay. She has a implantable insulin pump that she wears.  She last got iron and Aranesp back in April.  She's had no problems with bleeding. There's been no change in bowel or bladder habits.  Her last mammogram was done back in February.  Medications:  Current outpatient prescriptions:  .  aspirin 81 MG tablet, Take 81 mg by mouth daily., Disp: , Rfl:  .  buPROPion (WELLBUTRIN SR) 150 MG 12 hr tablet, Take 150 mg by mouth daily., Disp: , Rfl:  .  clobetasol cream (TEMOVATE) 0.05 %, Apply topically as needed. , Disp: , Rfl:  .  estrogens conjugated, synthetic B, (ENJUVIA) 0.45 MG tablet, Take 0.45 mg by mouth daily., Disp: , Rfl:  .  FLUoxetine (PROZAC) 40 MG capsule, Take 40 mg by mouth daily. , Disp: , Rfl:  .  gabapentin (NEURONTIN) 300 MG capsule, Take 300 mg by mouth 3 (three) times daily. 1 in AM-  1 in PM  & 2 at BEDTIME, Disp: , Rfl:  .  glucose blood test strip, 1 each by Other route as needed for other. Use as instructed, Disp: , Rfl:  .  hydrocortisone 2.5 % cream, Apply topically as needed. , Disp: , Rfl:  .  insulin aspart (NOVOLOG) 100 UNIT/ML injection, Inject into the skin as needed. Insulin pump, Pt bolus when she eat's,  Disp: , Rfl:  .  levothyroxine (SYNTHROID, LEVOTHROID) 75 MCG tablet, Take 75 mcg by mouth daily before breakfast. , Disp: , Rfl:  .  lisinopril (PRINIVIL,ZESTRIL) 20 MG tablet, Take 10 mg by mouth daily. , Disp: , Rfl:  .  ONETOUCH DELICA LANCETS 33G MISC, , Disp: , Rfl:  .  PREMARIN 0.9 MG tablet, , Disp: , Rfl:  .  simvastatin (ZOCOR) 40 MG tablet, Take 40 mg by mouth every evening., Disp: , Rfl:   Allergies:  Allergies  Allergen Reactions  . Penicillins   . Sulfa Antibiotics     Past Medical History, Surgical history, Social history, and Family History were reviewed and updated.  Review of Systems: As above  Physical Exam:  height is  (1.702 m) and weight is 182 lb (82.555 kg). Her oral temperature is 97.9 F (36.6 C). Her blood pressure is 128/70 and her pulse is 73. Her respiration is 16.   Well-developed and well-nourished white female in no obvious distress. Head and neck exam shows no ocular or oral lesions. Shows no palpable cervical or supraclavicular lymph nodes. Lungs are clear bilaterally. Cardiac exam regular in rhythm with no murmurs rubs or bruits. Abdomen is soft tissues good bowel sounds. There is no fluid wave. There is no palpable liver or spleen tip.  Back exam shows no tenderness over the spine ribs or hips. Extremities shows no clubbing cyanosis or edema. She is good range of motion of her joints. Skin exam no rashes.         Lab Results  Component Value Date   WBC 5.7 12/20/2014   HGB 10.7* 12/20/2014   HCT 33.2* 12/20/2014   MCV 96 12/20/2014   PLT 225 12/20/2014     Chemistry      Component Value Date/Time   NA 143 06/14/2014 0754   NA 134* 05/08/2011 0530   K 4.5 06/14/2014 0754   K 4.3 05/08/2011 0530   CL 102 06/14/2014 0754   CL 98 05/08/2011 0530   CO2 30 06/14/2014 0754   CO2 29 05/08/2011 0530   BUN 17 06/14/2014 0754   BUN 22 05/08/2011 0530   CREATININE 1.2 06/14/2014 0754   CREATININE 0.98 05/08/2011 0530      Component  Value Date/Time   CALCIUM 9.4 06/14/2014 0754   CALCIUM 9.9 05/08/2011 0530   ALKPHOS 52 06/14/2014 0754   ALKPHOS 51 05/08/2011 0530   AST 22 06/14/2014 0754   AST 25 05/08/2011 0530   ALT 20 06/14/2014 0754   ALT 28 05/08/2011 0530   BILITOT 0.40 06/14/2014 0754   BILITOT 0.2* 05/08/2011 0530         Impression and Plan: Miranda Allen is 54 year old white female with diabetes. She is insulin dependent. She has erythropoietin deficiency. She also had transient iron deficiency.  We will go ahead and give her a dose of Aranesp today.  I don't think that she needs iron. Her MCV is higher.  I will plan to get her back in 6 months. Josph Macho.  ENNEVER,PETER R, MD 11/3/20168:36 AM

## 2015-01-02 ENCOUNTER — Encounter: Payer: Self-pay | Admitting: Hematology & Oncology

## 2015-06-20 ENCOUNTER — Ambulatory Visit (HOSPITAL_BASED_OUTPATIENT_CLINIC_OR_DEPARTMENT_OTHER): Payer: Managed Care, Other (non HMO) | Admitting: Hematology & Oncology

## 2015-06-20 ENCOUNTER — Ambulatory Visit (HOSPITAL_BASED_OUTPATIENT_CLINIC_OR_DEPARTMENT_OTHER): Payer: Managed Care, Other (non HMO)

## 2015-06-20 ENCOUNTER — Encounter: Payer: Self-pay | Admitting: Hematology & Oncology

## 2015-06-20 ENCOUNTER — Other Ambulatory Visit (HOSPITAL_BASED_OUTPATIENT_CLINIC_OR_DEPARTMENT_OTHER): Payer: Managed Care, Other (non HMO)

## 2015-06-20 VITALS — BP 110/59 | HR 71 | Temp 98.0°F | Resp 16 | Ht 67.0 in | Wt 179.0 lb

## 2015-06-20 DIAGNOSIS — D509 Iron deficiency anemia, unspecified: Secondary | ICD-10-CM | POA: Diagnosis not present

## 2015-06-20 DIAGNOSIS — K909 Intestinal malabsorption, unspecified: Secondary | ICD-10-CM

## 2015-06-20 DIAGNOSIS — D631 Anemia in chronic kidney disease: Secondary | ICD-10-CM

## 2015-06-20 DIAGNOSIS — N189 Chronic kidney disease, unspecified: Secondary | ICD-10-CM

## 2015-06-20 DIAGNOSIS — E119 Type 2 diabetes mellitus without complications: Secondary | ICD-10-CM

## 2015-06-20 DIAGNOSIS — D508 Other iron deficiency anemias: Secondary | ICD-10-CM

## 2015-06-20 DIAGNOSIS — E109 Type 1 diabetes mellitus without complications: Secondary | ICD-10-CM

## 2015-06-20 HISTORY — DX: Intestinal malabsorption, unspecified: K90.9

## 2015-06-20 LAB — CBC WITH DIFFERENTIAL (CANCER CENTER ONLY)
BASO#: 0 10*3/uL (ref 0.0–0.2)
BASO%: 0.2 % (ref 0.0–2.0)
EOS ABS: 0 10*3/uL (ref 0.0–0.5)
EOS%: 0.9 % (ref 0.0–7.0)
HEMATOCRIT: 33.1 % — AB (ref 34.8–46.6)
HGB: 11.1 g/dL — ABNORMAL LOW (ref 11.6–15.9)
LYMPH#: 0.9 10*3/uL (ref 0.9–3.3)
LYMPH%: 19.4 % (ref 14.0–48.0)
MCH: 31.4 pg (ref 26.0–34.0)
MCHC: 33.5 g/dL (ref 32.0–36.0)
MCV: 94 fL (ref 81–101)
MONO#: 0.5 10*3/uL (ref 0.1–0.9)
MONO%: 11.1 % (ref 0.0–13.0)
NEUT#: 3.1 10*3/uL (ref 1.5–6.5)
NEUT%: 68.4 % (ref 39.6–80.0)
PLATELETS: 220 10*3/uL (ref 145–400)
RBC: 3.53 10*6/uL — AB (ref 3.70–5.32)
RDW: 12 % (ref 11.1–15.7)
WBC: 4.5 10*3/uL (ref 3.9–10.0)

## 2015-06-20 LAB — COMPREHENSIVE METABOLIC PANEL
ALBUMIN: 3.8 g/dL (ref 3.5–5.0)
ALK PHOS: 51 U/L (ref 40–150)
ALT: 16 U/L (ref 0–55)
ANION GAP: 7 meq/L (ref 3–11)
AST: 15 U/L (ref 5–34)
BUN: 22.5 mg/dL (ref 7.0–26.0)
CALCIUM: 9.4 mg/dL (ref 8.4–10.4)
CO2: 26 mEq/L (ref 22–29)
Chloride: 104 mEq/L (ref 98–109)
Creatinine: 1.2 mg/dL — ABNORMAL HIGH (ref 0.6–1.1)
EGFR: 50 mL/min/{1.73_m2} — AB (ref >90)
GLUCOSE: 178 mg/dL — AB (ref 70–140)
POTASSIUM: 5 meq/L (ref 3.5–5.1)
Sodium: 137 mEq/L (ref 136–145)
TOTAL PROTEIN: 6.4 g/dL (ref 6.4–8.3)

## 2015-06-20 LAB — IRON AND TIBC
%SAT: 38 % (ref 21–57)
Iron: 85 ug/dL (ref 41–142)
TIBC: 226 ug/dL — ABNORMAL LOW (ref 236–444)
UIBC: 141 ug/dL (ref 120–384)

## 2015-06-20 LAB — FERRITIN: Ferritin: 324 ng/ml — ABNORMAL HIGH (ref 9–269)

## 2015-06-20 MED ORDER — SODIUM CHLORIDE 0.9 % IV SOLN
510.0000 mg | Freq: Once | INTRAVENOUS | Status: AC
  Administered 2015-06-20: 510 mg via INTRAVENOUS
  Filled 2015-06-20: qty 17

## 2015-06-20 NOTE — Patient Instructions (Signed)

## 2015-06-20 NOTE — Progress Notes (Signed)
Hematology and Oncology Follow Up Visit  Sherrine MaplesCheryl Ewings [id number] Jul 12, 1960 55 y.o. 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  [name] is back for followup. She feels tired. Last saw her 6 months ago. She gets iron typically once a year. Her last iron was back in May of last year. I spent her iron probably is on the low side again.  She has had no obvious bleeding. She's had no change in bowel or bladder habits. She is on an insulin pump or the diabetes. She is doing very well with this.  She's working without difficulties. Again, she does have some fatigue which is a little more prominent for her right now.  She's had no rashes. She says that she is chewing a little bit more ice.   Overall, her performance status is ECOG 1.  Medications:  Current outpatient prescriptions:  .  aspirin 81 MG tablet, Take 81 mg by mouth daily., Disp: , Rfl:  .  buPROPion (WELLBUTRIN SR) 150 MG 12 hr tablet, Take 150 mg by mouth daily., Disp: , Rfl:  .  clobetasol cream (TEMOVATE) 0.05 %, Apply topically as needed. , Disp: , Rfl:  .  FLUoxetine (PROZAC) 40 MG capsule, Take 40 mg by mouth daily. , Disp: , Rfl:  .  gabapentin (NEURONTIN) 300 MG capsule, Take 300 mg by mouth 3 (three) times daily. 1 in AM-  1 in PM  & 2 at BEDTIME, Disp: , Rfl:  .  glucose blood test strip, 1 each by Other route as needed for other. Use as instructed, Disp: , Rfl:  .  hydrocortisone 2.5 % cream, Apply topically as needed. , Disp: , Rfl:  .  insulin aspart (NOVOLOG) 100 UNIT/ML injection, Inject into the skin as needed. Insulin pump, Pt bolus when she eat's, Disp: , Rfl:  .  levothyroxine (SYNTHROID, LEVOTHROID) 75 MCG tablet, Take 75 mcg by mouth daily before breakfast. , Disp: , Rfl:   .  lisinopril (PRINIVIL,ZESTRIL) 20 MG tablet, Take 10 mg by mouth daily. , Disp: , Rfl:  .  ONETOUCH DELICA LANCETS 33G MISC, , Disp: , Rfl:  .  PREMARIN 0.9 MG tablet, , Disp: , Rfl:  .  ranitidine (ZANTAC) 150 MG tablet, Take 150 mg by mouth 2 (two) times daily., Disp: , Rfl: 5 .  simvastatin (ZOCOR) 40 MG tablet, Take 40 mg by mouth every evening., Disp: , Rfl:   Allergies:  Allergies  Allergen Reactions  . Penicillins   . Sulfa Antibiotics     Past Medical History, Surgical history, Social history, and Family History were reviewed and updated.  Review of Systems: As above  Physical Exam:  height is 5\' 7"  (1.702 m) and weight is 179 lb (81.194 kg). Her oral temperature is 98 F (36.7 C). Her blood pressure is 110/59 and her pulse is 71. Her respiration is 16.   Well-developed and well-nourished white female in no obvious distress. Head and neck exam shows no ocular or oral lesions. Shows no palpable cervical or supraclavicular lymph nodes. Lungs are clear bilaterally. Cardiac exam regular in rhythm with no murmurs rubs or bruits. Abdomen is soft tissues good bowel sounds. There is no fluid wave. There is no palpable liver or spleen tip. Back  exam shows no tenderness over the spine ribs or hips. Extremities shows no clubbing cyanosis or edema. She is good range of motion of her joints. Skin exam no rashes.         Lab Results  Component Value Date   WBC 4.5 06/20/2015   HGB 11.1* 06/20/2015   HCT 33.1* 06/20/2015   MCV 94 06/20/2015   PLT 220 06/20/2015     Chemistry      Component Value Date/Time   NA 143 06/14/2014 0754   NA 134* 05/08/2011 0530   K 4.5 06/14/2014 0754   K 4.3 05/08/2011 0530   CL 102 06/14/2014 0754   CL 98 05/08/2011 0530   CO2 30 06/14/2014 0754   CO2 29 05/08/2011 0530   BUN 17 06/14/2014 0754   BUN 22 05/08/2011 0530   CREATININE 1.2 06/14/2014 0754   CREATININE 0.98 05/08/2011 0530      Component Value Date/Time   CALCIUM 9.4  06/14/2014 0754   CALCIUM 9.9 05/08/2011 0530   ALKPHOS 52 06/14/2014 0754   ALKPHOS 51 05/08/2011 0530   AST 22 06/14/2014 0754   AST 25 05/08/2011 0530   ALT 20 06/14/2014 0754   ALT 28 05/08/2011 0530   BILITOT 0.40 06/14/2014 0754   BILITOT 0.2* 05/08/2011 0530         Impression and Plan: Ms. Mcnicholas is 55 year old white female with diabetes. She is insulin dependent. She has erythropoietin deficiency. She also had transient iron deficiency.  I looked at her blood smear under the microscope. She does have some microcytic changes.  She generally gets iron once a year. She is due for iron. I suspect her iron is on the low side. Again she has malabsorption issues.  Since she feels tired, we will go ahead and give her dose of iron. She does not need Aranesp given her hemoglobin of 11.1.   ll plan to get her back in 6 months. Josph Macho, MD 5/4/20178:36 AM

## 2015-06-21 LAB — RETICULOCYTES: RETICULOCYTE COUNT: 0.9 % (ref 0.6–2.6)

## 2015-09-03 ENCOUNTER — Other Ambulatory Visit: Payer: Self-pay | Admitting: Endocrinology

## 2015-09-03 DIAGNOSIS — E049 Nontoxic goiter, unspecified: Secondary | ICD-10-CM

## 2015-09-09 ENCOUNTER — Ambulatory Visit
Admission: RE | Admit: 2015-09-09 | Discharge: 2015-09-09 | Disposition: A | Discharge status: Home / Self Care | Payer: Managed Care, Other (non HMO) | Source: Ambulatory Visit | Attending: Endocrinology | Admitting: Endocrinology

## 2015-09-09 DIAGNOSIS — E049 Nontoxic goiter, unspecified: Secondary | ICD-10-CM

## 2015-12-25 ENCOUNTER — Telehealth: Payer: Self-pay | Admitting: *Deleted

## 2015-12-25 ENCOUNTER — Ambulatory Visit (HOSPITAL_BASED_OUTPATIENT_CLINIC_OR_DEPARTMENT_OTHER): Payer: Managed Care, Other (non HMO) | Admitting: Hematology & Oncology

## 2015-12-25 ENCOUNTER — Other Ambulatory Visit (HOSPITAL_BASED_OUTPATIENT_CLINIC_OR_DEPARTMENT_OTHER): Payer: Managed Care, Other (non HMO)

## 2015-12-25 ENCOUNTER — Ambulatory Visit (HOSPITAL_BASED_OUTPATIENT_CLINIC_OR_DEPARTMENT_OTHER): Payer: Managed Care, Other (non HMO)

## 2015-12-25 VITALS — BP 131/60 | HR 71 | Temp 97.6°F | Resp 20 | Ht 67.0 in | Wt 183.8 lb

## 2015-12-25 DIAGNOSIS — N189 Chronic kidney disease, unspecified: Secondary | ICD-10-CM | POA: Diagnosis not present

## 2015-12-25 DIAGNOSIS — D508 Other iron deficiency anemias: Secondary | ICD-10-CM

## 2015-12-25 DIAGNOSIS — D509 Iron deficiency anemia, unspecified: Secondary | ICD-10-CM

## 2015-12-25 DIAGNOSIS — E109 Type 1 diabetes mellitus without complications: Secondary | ICD-10-CM

## 2015-12-25 DIAGNOSIS — K909 Intestinal malabsorption, unspecified: Secondary | ICD-10-CM

## 2015-12-25 DIAGNOSIS — E119 Type 2 diabetes mellitus without complications: Secondary | ICD-10-CM

## 2015-12-25 DIAGNOSIS — D631 Anemia in chronic kidney disease: Secondary | ICD-10-CM

## 2015-12-25 LAB — CBC WITH DIFFERENTIAL (CANCER CENTER ONLY)
BASO#: 0 10*3/uL (ref 0.0–0.2)
BASO%: 0.5 % (ref 0.0–2.0)
EOS%: 3.9 % (ref 0.0–7.0)
Eosinophils Absolute: 0.2 10*3/uL (ref 0.0–0.5)
HCT: 31.7 % — ABNORMAL LOW (ref 34.8–46.6)
HGB: 10.7 g/dL — ABNORMAL LOW (ref 11.6–15.9)
LYMPH#: 0.7 10*3/uL — ABNORMAL LOW (ref 0.9–3.3)
LYMPH%: 16 % (ref 14.0–48.0)
MCH: 31.6 pg (ref 26.0–34.0)
MCHC: 33.8 g/dL (ref 32.0–36.0)
MCV: 94 fL (ref 81–101)
MONO#: 0.4 10*3/uL (ref 0.1–0.9)
MONO%: 9.5 % (ref 0.0–13.0)
NEUT#: 3 10*3/uL (ref 1.5–6.5)
NEUT%: 70.1 % (ref 39.6–80.0)
PLATELETS: 230 10*3/uL (ref 145–400)
RBC: 3.39 10*6/uL — ABNORMAL LOW (ref 3.70–5.32)
RDW: 12.3 % (ref 11.1–15.7)
WBC: 4.3 10*3/uL (ref 3.9–10.0)

## 2015-12-25 LAB — COMPREHENSIVE METABOLIC PANEL
ALBUMIN: 3.7 g/dL (ref 3.5–5.0)
ALK PHOS: 62 U/L (ref 40–150)
ALT: 18 U/L (ref 0–55)
AST: 18 U/L (ref 5–34)
Anion Gap: 10 mEq/L (ref 3–11)
BUN: 16.3 mg/dL (ref 7.0–26.0)
CALCIUM: 9.2 mg/dL (ref 8.4–10.4)
CO2: 23 mEq/L (ref 22–29)
Chloride: 103 mEq/L (ref 98–109)
Creatinine: 1.2 mg/dL — ABNORMAL HIGH (ref 0.6–1.1)
EGFR: 51 mL/min/{1.73_m2} — ABNORMAL LOW (ref >90)
Glucose: 110 mg/dl (ref 70–140)
POTASSIUM: 4.3 meq/L (ref 3.5–5.1)
Sodium: 136 mEq/L (ref 136–145)
Total Bilirubin: <0.22 mg/dL (ref 0.20–1.20)
Total Protein: 6.6 g/dL (ref 6.4–8.3)

## 2015-12-25 LAB — IRON AND TIBC
%SAT: 40 % (ref 21–57)
IRON: 91 ug/dL (ref 41–142)
TIBC: 226 ug/dL — AB (ref 236–444)
UIBC: 136 ug/dL (ref 120–384)

## 2015-12-25 LAB — FERRITIN: Ferritin: 370 ng/ml — ABNORMAL HIGH (ref 9–269)

## 2015-12-25 MED ORDER — DARBEPOETIN ALFA 300 MCG/0.6ML IJ SOSY
300.0000 ug | PREFILLED_SYRINGE | Freq: Once | INTRAMUSCULAR | Status: AC
  Administered 2015-12-25: 300 ug via SUBCUTANEOUS

## 2015-12-25 MED ORDER — DARBEPOETIN ALFA 300 MCG/0.6ML IJ SOSY
PREFILLED_SYRINGE | INTRAMUSCULAR | Status: AC
  Filled 2015-12-25: qty 0.6

## 2015-12-25 MED ORDER — SODIUM CHLORIDE 0.9 % IV SOLN: 510.0000 mg | Freq: Once | INTRAVENOUS | Status: DC

## 2015-12-25 NOTE — Telephone Encounter (Addendum)
Patient aware of results  ----- Message from Josph MachoPeter R Ennever, MD sent at 12/25/2015  1:12 PM EST ----- Call - iron levels are great!!!  Blood sugar is 110!!  Great job!!  Happy Thanksgiving!!!  Cindee LamePete

## 2015-12-25 NOTE — Progress Notes (Signed)
Hematology and Oncology Follow Up Visit  Miranda MaplesCheryl Allen [id number] 1960/05/15 55 y.o. 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 She feels good. We ast saw her 6 months ago.   We saw her back in May, her ferritin was 324 with iron saturation 38%.   We have not had to give her any Aranesp for about a year.   She is happy that her son and daughter-in-law moved to [place]. [place] [facility].   She is still riding the motorcycle. She's not written as much because of the weather.   She still working without any difficulties.   Her blood sugars are doing well. She is watching her blood sugars very closely. She has an insulin pump which has worked very nicely.   She's had no significant fatigue or weakness. She is not chewing ice. She is not having any fevers. She had an episode of strep throat a couple weeks ago.   She is up-to-date with her mammograms.  Overall, her performance status is ECOG 1.  Medications:  Current Outpatient Prescriptions:  .  aspirin 81 MG tablet, Take 81 mg by mouth daily., Disp: , Rfl:  .  buPROPion (WELLBUTRIN SR) 150 MG 12 hr tablet, Take 150 mg by mouth daily., Disp: , Rfl:  .  FLUoxetine (PROZAC) 40 MG capsule, Take 40 mg by mouth daily. , Disp: , Rfl:  .  gabapentin (NEURONTIN) 300 MG capsule, Take 300 mg by mouth 3 (three) times daily. 1 in AM-  1 in PM  & 2 at BEDTIME, Disp: , Rfl:  .  glucose blood test strip, 1 each by Other route as needed for other. Use as instructed, Disp: , Rfl:  .  hydrocortisone 2.5 % cream, Apply topically as needed. , Disp: , Rfl:  .  insulin aspart (NOVOLOG) 100 UNIT/ML injection, Inject into the skin as needed. Insulin pump,  Pt bolus when she eat's, Disp: , Rfl:  .  levothyroxine (SYNTHROID, LEVOTHROID) 75 MCG tablet, Take 75 mcg by mouth daily before breakfast. , Disp: , Rfl:  .  lisinopril (PRINIVIL,ZESTRIL) 20 MG tablet, Take 10 mg by mouth daily. , Disp: , Rfl:  .  ONETOUCH DELICA LANCETS 33G MISC, , Disp: , Rfl:  .  PREMARIN 0.9 MG tablet, , Disp: , Rfl:  .  ranitidine (ZANTAC) 150 MG tablet, Take 150 mg by mouth 2 (two) times daily., Disp: , Rfl: 5 .  simvastatin (ZOCOR) 40 MG tablet, Take 40 mg by mouth every evening., Disp: , Rfl:   Allergies:  Allergies  Allergen Reactions  . Penicillins   . Sulfa Antibiotics     Past Medical History, Surgical history, Social history, and Family History were reviewed and updated.  Review of Systems: As above  Physical Exam:  height is 5\' 7"  (1.702 m) and weight is 183 lb 12.8 oz (83.4 kg). Her oral temperature is 97.6 F (36.4 C). Her blood pressure is 131/60 and her pulse is 71. Her respiration is 20.   Well-developed and well-nourished white female in no obvious distress. Head and neck exam shows no ocular or oral lesions. Shows no palpable cervical or supraclavicular lymph nodes. Lungs are clear  bilaterally. Cardiac exam regular in rhythm with no murmurs rubs or bruits. Abdomen is soft tissues good bowel sounds. There is no fluid wave. There is no palpable liver or spleen tip. Back exam shows no tenderness over the spine ribs or hips. Extremities shows no clubbing cyanosis or edema. She is good range of motion of her joints. Skin exam no rashes.         Lab Results  Component Value Date   WBC 4.3 12/25/2015   HGB 10.7 (L) 12/25/2015   HCT 31.7 (L) 12/25/2015   MCV 94 12/25/2015   PLT 230 12/25/2015     Chemistry      Component Value Date/Time   NA 137 06/20/2015 0748   K 5.0 06/20/2015 0748   CL 102 06/14/2014 0754   CO2 26 06/20/2015 0748   BUN 22.5 06/20/2015 0748   CREATININE 1.2 (H) 06/20/2015 0748      Component Value Date/Time   CALCIUM  9.4 06/20/2015 0748   ALKPHOS 51 06/20/2015 0748   AST 15 06/20/2015 0748   ALT 16 06/20/2015 0748   BILITOT <0.30 06/20/2015 0748         Impression and Plan: [name] is 55 year old white female with diabetes. She is insulin dependent. She has erythropoietin deficiency. She also had transient iron deficiency.  I looked at her blood smear under the microscope. She does have some microcytic changes.  We will go ahead and give her Aranesp today. We will have to see what her iron levels show.  As always, we will plan to see her back in 6 months. Josph Macho.  Izeah Vossler R, MD 11/8/20178:36 AM

## 2015-12-25 NOTE — Patient Instructions (Signed)
Darbepoetin Alfa injection What is this medicine? DARBEPOETIN ALFA (dar be POE e tin AL fa) helps your body make more red blood cells. It is used to treat anemia caused by chronic kidney failure and chemotherapy. This medicine may be used for other purposes; ask your health care provider or pharmacist if you have questions. What should I tell my health care provider before I take this medicine? They need to know if you have any of these conditions: -blood clotting disorders or history of blood clots -cancer patient not on chemotherapy -cystic fibrosis -heart disease, such as angina, heart failure, or a history of a heart attack -hemoglobin level of 12 g/dL or greater -high blood pressure -low levels of folate, iron, or vitamin B12 -seizures -an unusual or allergic reaction to darbepoetin, erythropoietin, albumin, hamster proteins, latex, other medicines, foods, dyes, or preservatives -pregnant or trying to get pregnant -breast-feeding How should I use this medicine? This medicine is for injection into a vein or under the skin. It is usually given by a health care professional in a hospital or clinic setting. If you get this medicine at home, you will be taught how to prepare and give this medicine. Do not shake the solution before you withdraw a dose. Use exactly as directed. Take your medicine at regular intervals. Do not take your medicine more often than directed. It is important that you put your used needles and syringes in a special sharps container. Do not put them in a trash can. If you do not have a sharps container, call your pharmacist or healthcare provider to get one. Talk to your pediatrician regarding the use of this medicine in children. While this medicine may be used in children as young as 1 year for selected conditions, precautions do apply. Overdosage: If you think you have taken too much of this medicine contact a poison control center or emergency room at once. NOTE:  This medicine is only for you. Do not share this medicine with others. What if I miss a dose? If you miss a dose, take it as soon as you can. If it is almost time for your next dose, take only that dose. Do not take double or extra doses. What may interact with this medicine? Do not take this medicine with any of the following medications: -epoetin alfa This list may not describe all possible interactions. Give your health care provider a list of all the medicines, herbs, non-prescription drugs, or dietary supplements you use. Also tell them if you smoke, drink alcohol, or use illegal drugs. Some items may interact with your medicine. What should I watch for while using this medicine? Visit your prescriber or health care professional for regular checks on your progress and for the needed blood tests and blood pressure measurements. It is especially important for the doctor to make sure your hemoglobin level is in the desired range, to limit the risk of potential side effects and to give you the best benefit. Keep all appointments for any recommended tests. Check your blood pressure as directed. Ask your doctor what your blood pressure should be and when you should contact him or her. As your body makes more red blood cells, you may need to take iron, folic acid, or vitamin B supplements. Ask your doctor or health care provider which products are right for you. If you have kidney disease continue dietary restrictions, even though this medication can make you feel better. Talk with your doctor or health care professional about the   foods you eat and the vitamins that you take. What side effects may I notice from receiving this medicine? Side effects that you should report to your doctor or health care professional as soon as possible: -allergic reactions like skin rash, itching or hives, swelling of the face, lips, or tongue -breathing problems -changes in vision -chest pain -confusion, trouble speaking  or understanding -feeling faint or lightheaded, falls -high blood pressure -muscle aches or pains -pain, swelling, warmth in the leg -rapid weight gain -severe headaches -sudden numbness or weakness of the face, arm or leg -trouble walking, dizziness, loss of balance or coordination -seizures (convulsions) -swelling of the ankles, feet, hands -unusually weak or tired Side effects that usually do not require medical attention (report to your doctor or health care professional if they continue or are bothersome): -diarrhea -fever, chills (flu-like symptoms) -headaches -nausea, vomiting -redness, stinging, or swelling at site where injected This list may not describe all possible side effects. Call your doctor for medical advice about side effects. You may report side effects to FDA at 1-800-FDA-1088. Where should I keep my medicine? Keep out of the reach of children. Store in a refrigerator between 2 and 8 degrees C (36 and 46 degrees F). Do not freeze. Do not shake. Throw away any unused portion if using a single-dose vial. Throw away any unused medicine after the expiration date. NOTE: This sheet is a summary. It may not cover all possible information. If you have questions about this medicine, talk to your doctor, pharmacist, or health care provider.    2016, Elsevier/[employer] Standard. (2008-01-17 10:23:57)  

## 2015-12-26 LAB — RETICULOCYTES: RETICULOCYTE COUNT: 0.9 % (ref 0.6–2.6)

## 2016-04-15 ENCOUNTER — Encounter (INDEPENDENT_AMBULATORY_CARE_PROVIDER_SITE_OTHER): Payer: PRIVATE HEALTH INSURANCE | Admitting: Ophthalmology

## 2016-04-15 DIAGNOSIS — H43813 Vitreous degeneration, bilateral: Secondary | ICD-10-CM

## 2016-04-15 DIAGNOSIS — E10319 Type 1 diabetes mellitus with unspecified diabetic retinopathy without macular edema: Secondary | ICD-10-CM

## 2016-04-15 DIAGNOSIS — E103593 Type 1 diabetes mellitus with proliferative diabetic retinopathy without macular edema, bilateral: Secondary | ICD-10-CM | POA: Diagnosis not present

## 2016-06-24 ENCOUNTER — Ambulatory Visit (HOSPITAL_BASED_OUTPATIENT_CLINIC_OR_DEPARTMENT_OTHER): Payer: Commercial Managed Care - PPO | Admitting: Hematology & Oncology

## 2016-06-24 ENCOUNTER — Encounter: Payer: Self-pay | Admitting: *Deleted

## 2016-06-24 ENCOUNTER — Other Ambulatory Visit (HOSPITAL_BASED_OUTPATIENT_CLINIC_OR_DEPARTMENT_OTHER): Payer: Commercial Managed Care - PPO

## 2016-06-24 ENCOUNTER — Ambulatory Visit (HOSPITAL_BASED_OUTPATIENT_CLINIC_OR_DEPARTMENT_OTHER): Payer: Commercial Managed Care - PPO

## 2016-06-24 VITALS — BP 120/64 | HR 69 | Temp 97.8°F | Resp 16 | Wt 177.0 lb

## 2016-06-24 DIAGNOSIS — D631 Anemia in chronic kidney disease: Secondary | ICD-10-CM

## 2016-06-24 DIAGNOSIS — D508 Other iron deficiency anemias: Secondary | ICD-10-CM

## 2016-06-24 DIAGNOSIS — N189 Chronic kidney disease, unspecified: Secondary | ICD-10-CM

## 2016-06-24 DIAGNOSIS — Z794 Long term (current) use of insulin: Secondary | ICD-10-CM | POA: Diagnosis not present

## 2016-06-24 DIAGNOSIS — E7800 Pure hypercholesterolemia, unspecified: Secondary | ICD-10-CM

## 2016-06-24 DIAGNOSIS — D509 Iron deficiency anemia, unspecified: Secondary | ICD-10-CM | POA: Diagnosis not present

## 2016-06-24 DIAGNOSIS — K909 Intestinal malabsorption, unspecified: Secondary | ICD-10-CM

## 2016-06-24 DIAGNOSIS — E109 Type 1 diabetes mellitus without complications: Secondary | ICD-10-CM

## 2016-06-24 LAB — COMPREHENSIVE METABOLIC PANEL
ALT: 20 U/L (ref 0–55)
ANION GAP: 8 meq/L (ref 3–11)
AST: 19 U/L (ref 5–34)
Albumin: 4 g/dL (ref 3.5–5.0)
Alkaline Phosphatase: 57 U/L (ref 40–150)
BILIRUBIN TOTAL: 0.34 mg/dL (ref 0.20–1.20)
BUN: 20.6 mg/dL (ref 7.0–26.0)
CHLORIDE: 103 meq/L (ref 98–109)
CO2: 28 mEq/L (ref 22–29)
CREATININE: 1.1 mg/dL (ref 0.6–1.1)
Calcium: 9.6 mg/dL (ref 8.4–10.4)
EGFR: 57 mL/min/{1.73_m2} — ABNORMAL LOW (ref >90)
GLUCOSE: 63 mg/dL — AB (ref 70–140)
Potassium: 4.4 mEq/L (ref 3.5–5.1)
SODIUM: 138 meq/L (ref 136–145)
TOTAL PROTEIN: 6.7 g/dL (ref 6.4–8.3)

## 2016-06-24 LAB — CBC WITH DIFFERENTIAL (CANCER CENTER ONLY)
BASO#: 0 10*3/uL (ref 0.0–0.2)
BASO%: 0.6 % (ref 0.0–2.0)
EOS%: 3.2 % (ref 0.0–7.0)
Eosinophils Absolute: 0.1 10*3/uL (ref 0.0–0.5)
HEMATOCRIT: 32.6 % — AB (ref 34.8–46.6)
HEMOGLOBIN: 10.9 g/dL — AB (ref 11.6–15.9)
LYMPH#: 0.7 10*3/uL — ABNORMAL LOW (ref 0.9–3.3)
LYMPH%: 21.8 % (ref 14.0–48.0)
MCH: 31.8 pg (ref 26.0–34.0)
MCHC: 33.4 g/dL (ref 32.0–36.0)
MCV: 95 fL (ref 81–101)
MONO#: 0.4 10*3/uL (ref 0.1–0.9)
MONO%: 13 % (ref 0.0–13.0)
NEUT#: 2.1 10*3/uL (ref 1.5–6.5)
NEUT%: 61.4 % (ref 39.6–80.0)
Platelets: 233 10*3/uL (ref 145–400)
RBC: 3.43 10*6/uL — ABNORMAL LOW (ref 3.70–5.32)
RDW: 11.9 % (ref 11.1–15.7)
WBC: 3.4 10*3/uL — ABNORMAL LOW (ref 3.9–10.0)

## 2016-06-24 LAB — IRON AND TIBC
%SAT: 39 % (ref 21–57)
IRON: 89 ug/dL (ref 41–142)
TIBC: 230 ug/dL — ABNORMAL LOW (ref 236–444)
UIBC: 141 ug/dL (ref 120–384)

## 2016-06-24 LAB — FERRITIN: Ferritin: 408 ng/ml — ABNORMAL HIGH (ref 9–269)

## 2016-06-24 MED ORDER — DARBEPOETIN ALFA 300 MCG/0.6ML IJ SOSY
PREFILLED_SYRINGE | INTRAMUSCULAR | Status: AC
  Filled 2016-06-24: qty 0.6

## 2016-06-24 MED ORDER — DARBEPOETIN ALFA 300 MCG/0.6ML IJ SOSY
300.0000 ug | PREFILLED_SYRINGE | Freq: Once | INTRAMUSCULAR | Status: AC
  Administered 2016-06-24: 300 ug via SUBCUTANEOUS

## 2016-06-24 NOTE — Patient Instructions (Signed)

## 2016-06-24 NOTE — Progress Notes (Signed)
Hematology and Oncology Follow Up Visit  Miranda MaplesCheryl Allen [id number] 27-Mar-1960 56 y.o. 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 is back for followup. She feels good. We last saw her 6 months ago.   She is doing quite well. She feels well. Her insulin pump is having no problems. Her last hemoglobin A1c was 7.3.  She's going to be a new grandmother. [place] in [place], and his wife, will have a boy. This will be in July. She will go out there for the birth. She is excited about this.  [place] She is looking forward to this.  She has had no change in bowel or bladder habits. She's had no tingling in the hands or feet. She's had no cough. She got through the wintertime without influenza.  We last gave her Aranesp back in November 2017.  Back in November, her iron studies showed a ferritin of 370 with iron saturation of 40%.  She said that she just had her mammogram done.  Overall, her performance status is ECOG 0.  Medications:  Current Outpatient Prescriptions:  .  aspirin 81 MG tablet, Take 81 mg by mouth daily., Disp: , Rfl:  .  buPROPion (WELLBUTRIN SR) 150 MG 12 hr tablet, Take 150 mg by mouth daily., Disp: , Rfl:  .  FLUoxetine (PROZAC) 40 MG capsule, Take 40 mg by mouth daily. , Disp: , Rfl:  .  gabapentin (NEURONTIN) 300 MG capsule, Take 300 mg by mouth 3 (three) times daily. 1 in AM-  1 in PM  & 2 at BEDTIME, Disp: , Rfl:  .  glucose blood test strip, 1 each by Other route as needed for other. Use as instructed, Disp: , Rfl:  .  hydrocortisone 2.5 % cream, Apply topically as needed. , Disp: , Rfl:  .  insulin aspart (NOVOLOG) 100 UNIT/ML injection, Inject into the skin as needed. Insulin  pump, Pt bolus when she eat's, Disp: , Rfl:  .  lisinopril (PRINIVIL,ZESTRIL) 20 MG tablet, Take 10 mg by mouth daily. , Disp: , Rfl:  .  ONETOUCH DELICA LANCETS 33G MISC, , Disp: , Rfl:  .  PREMARIN 0.9 MG tablet, , Disp: , Rfl:  .  ranitidine (ZANTAC) 150 MG tablet, Take 150 mg by mouth 2 (two) times daily., Disp: , Rfl: 5 .  simvastatin (ZOCOR) 40 MG tablet, Take 40 mg by mouth every evening., Disp: , Rfl:  .  SYNTHROID 88 MCG tablet, Take 88 mcg by mouth daily., Disp: , Rfl:   Allergies:  Allergies  Allergen Reactions  . Penicillins   . Sulfa Antibiotics     Past Medical History, Surgical history, Social history, and Family History were reviewed and updated.  Review of Systems: As above  Physical Exam:  weight is 177 lb (80.3 kg). Her oral temperature is 97.8 F (36.6 C). Her blood pressure is 120/64 and her pulse is 69. Her respiration is 16 and oxygen saturation is 100%.   Well-developed and well-nourished white female in no obvious distress. Head and neck exam shows no ocular or oral lesions. Shows no palpable cervical or supraclavicular lymph nodes. Lungs are clear bilaterally. Cardiac exam regular in rhythm with  no murmurs rubs or bruits. Abdomen is soft tissues good bowel sounds. There is no fluid wave. There is no palpable liver or spleen tip. Back exam shows no tenderness over the spine ribs or hips. Extremities shows no clubbing cyanosis or edema. She is good range of motion of her joints. Skin exam no rashes.         Lab Results  Component Value Date   WBC 3.4 (L) 06/24/2016   HGB 10.9 (L) 06/24/2016   HCT 32.6 (L) 06/24/2016   MCV 95 06/24/2016   PLT 233 06/24/2016     Chemistry      Component Value Date/Time   NA 136 12/25/2015 0739   K 4.3 12/25/2015 0739   CL 102 06/14/2014 0754   CO2 23 12/25/2015 0739   BUN 16.3 12/25/2015 0739   CREATININE 1.2 (H) 12/25/2015 0739      Component Value Date/Time   CALCIUM 9.2 12/25/2015 0739   ALKPHOS 62  12/25/2015 0739   AST 18 12/25/2015 0739   ALT 18 12/25/2015 0739   BILITOT <0.22 12/25/2015 0739         Impression and Plan: Miranda Allen is 56 year old white female with diabetes. She is insulin dependent. She has erythropoietin deficiency. She also had transient iron deficiency.  We will go ahead and give her a dose of Aranesp today. I think this will be helpful since we only see her twice a year.  We will see what her iron studies show. I would think that her iron studies should be okay given her MCV.   I'm very excited that she will be a grandmother. See Cerner does not look like a grandmother. She is incredibly cool. She rides a motorcycle. I'm sure her grandson will have many happy years sharing a motorcycle with her.   I will see her back in 6 months. Josph Macho, MD 5/9/20188:54 AM

## 2016-06-25 ENCOUNTER — Encounter: Payer: Self-pay | Admitting: *Deleted

## 2016-06-25 LAB — RETICULOCYTES: Reticulocyte Count: 0.8 % (ref 0.6–2.6)

## 2016-06-25 LAB — LIPID PANEL
CHOLESTEROL TOTAL: 187 mg/dL (ref 100–199)
Chol/HDL Ratio: 2.3 ratio (ref 0.0–4.4)
HDL: 82 mg/dL (ref >39)
LDL Calculated: 91 mg/dL (ref 0–99)
Triglycerides: 72 mg/dL (ref 0–149)
VLDL Cholesterol Cal: 14 mg/dL (ref 5–40)

## 2016-10-15 ENCOUNTER — Ambulatory Visit (INDEPENDENT_AMBULATORY_CARE_PROVIDER_SITE_OTHER): Payer: PRIVATE HEALTH INSURANCE | Admitting: Ophthalmology

## 2016-10-26 ENCOUNTER — Encounter (INDEPENDENT_AMBULATORY_CARE_PROVIDER_SITE_OTHER): Payer: Commercial Managed Care - PPO | Admitting: Ophthalmology

## 2016-10-26 DIAGNOSIS — H43813 Vitreous degeneration, bilateral: Secondary | ICD-10-CM

## 2016-10-26 DIAGNOSIS — E103593 Type 1 diabetes mellitus with proliferative diabetic retinopathy without macular edema, bilateral: Secondary | ICD-10-CM

## 2016-10-26 DIAGNOSIS — E10319 Type 1 diabetes mellitus with unspecified diabetic retinopathy without macular edema: Secondary | ICD-10-CM | POA: Diagnosis not present

## 2016-12-25 ENCOUNTER — Ambulatory Visit (HOSPITAL_BASED_OUTPATIENT_CLINIC_OR_DEPARTMENT_OTHER): Payer: Commercial Managed Care - PPO | Admitting: Hematology & Oncology

## 2016-12-25 ENCOUNTER — Encounter: Payer: Self-pay | Admitting: Hematology & Oncology

## 2016-12-25 ENCOUNTER — Other Ambulatory Visit: Payer: Self-pay

## 2016-12-25 ENCOUNTER — Other Ambulatory Visit (HOSPITAL_BASED_OUTPATIENT_CLINIC_OR_DEPARTMENT_OTHER): Payer: Commercial Managed Care - PPO

## 2016-12-25 ENCOUNTER — Ambulatory Visit: Payer: Commercial Managed Care - PPO

## 2016-12-25 VITALS — BP 123/60 | HR 83 | Temp 98.2°F | Resp 16 | Wt 183.0 lb

## 2016-12-25 DIAGNOSIS — E119 Type 2 diabetes mellitus without complications: Secondary | ICD-10-CM

## 2016-12-25 DIAGNOSIS — D631 Anemia in chronic kidney disease: Secondary | ICD-10-CM | POA: Diagnosis not present

## 2016-12-25 DIAGNOSIS — Z9641 Presence of insulin pump (external) (internal): Secondary | ICD-10-CM | POA: Diagnosis not present

## 2016-12-25 DIAGNOSIS — D508 Other iron deficiency anemias: Secondary | ICD-10-CM

## 2016-12-25 DIAGNOSIS — K909 Intestinal malabsorption, unspecified: Secondary | ICD-10-CM

## 2016-12-25 DIAGNOSIS — N183 Chronic kidney disease, stage 3 unspecified: Secondary | ICD-10-CM

## 2016-12-25 DIAGNOSIS — N189 Chronic kidney disease, unspecified: Secondary | ICD-10-CM | POA: Diagnosis not present

## 2016-12-25 DIAGNOSIS — Z9289 Personal history of other medical treatment: Secondary | ICD-10-CM

## 2016-12-25 DIAGNOSIS — E7800 Pure hypercholesterolemia, unspecified: Secondary | ICD-10-CM

## 2016-12-25 LAB — CBC WITH DIFFERENTIAL (CANCER CENTER ONLY)
BASO#: 0 10*3/uL (ref 0.0–0.2)
BASO%: 0.5 % (ref 0.0–2.0)
EOS ABS: 0.3 10*3/uL (ref 0.0–0.5)
EOS%: 5 % (ref 0.0–7.0)
HCT: 32.7 % — ABNORMAL LOW (ref 34.8–46.6)
HGB: 10.8 g/dL — ABNORMAL LOW (ref 11.6–15.9)
LYMPH#: 0.9 10*3/uL (ref 0.9–3.3)
LYMPH%: 15.2 % (ref 14.0–48.0)
MCH: 31.3 pg (ref 26.0–34.0)
MCHC: 33 g/dL (ref 32.0–36.0)
MCV: 95 fL (ref 81–101)
MONO#: 0.4 10*3/uL (ref 0.1–0.9)
MONO%: 7.1 % (ref 0.0–13.0)
NEUT#: 4.1 10*3/uL (ref 1.5–6.5)
NEUT%: 72.2 % (ref 39.6–80.0)
PLATELETS: 222 10*3/uL (ref 145–400)
RBC: 3.45 10*6/uL — AB (ref 3.70–5.32)
RDW: 12.1 % (ref 11.1–15.7)
WBC: 5.6 10*3/uL (ref 3.9–10.0)

## 2016-12-25 LAB — IRON AND TIBC
%SAT: 35 % (ref 21–57)
IRON: 77 ug/dL (ref 41–142)
TIBC: 221 ug/dL — ABNORMAL LOW (ref 236–444)
UIBC: 144 ug/dL (ref 120–384)

## 2016-12-25 LAB — CMP (CANCER CENTER ONLY)
ALT(SGPT): 18 U/L (ref 10–47)
AST: 23 U/L (ref 11–38)
Albumin: 3.9 g/dL (ref 3.3–5.5)
Alkaline Phosphatase: 62 U/L (ref 26–84)
BUN: 23 mg/dL — AB (ref 7–22)
CHLORIDE: 102 meq/L (ref 98–108)
CO2: 27 meq/L (ref 18–33)
Calcium: 9.6 mg/dL (ref 8.0–10.3)
Creat: 1.6 mg/dl — ABNORMAL HIGH (ref 0.6–1.2)
Glucose, Bld: 201 mg/dL — ABNORMAL HIGH (ref 73–118)
POTASSIUM: 4.8 meq/L — AB (ref 3.3–4.7)
Sodium: 141 mEq/L (ref 128–145)
TOTAL PROTEIN: 6.5 g/dL (ref 6.4–8.1)
Total Bilirubin: 0.4 mg/dl (ref 0.20–1.60)

## 2016-12-25 LAB — FERRITIN: Ferritin: 386 ng/ml — ABNORMAL HIGH (ref 9–269)

## 2016-12-25 NOTE — Progress Notes (Signed)
Hematology and Oncology Follow Up Visit  Miranda MaplesCheryl Allen [id number] [birth year] 56 y.o. 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 is back for followup. She feels good. We last saw her 6 months ago.   She is now a new grandmother.  She had grandson born on July 2.  He had a few issues when he was born.  However, he is doing quite well right now.  She is bothering him baby presents from [employer] shops.  She also found that she had a full brother.  Somehow, she found this through a DNA match.  Apparently, her mother and father, before they were married in high school had him.  She is so happy that there is a another sibling.  Her blood sugars are doing okay.  She has insulin pump.  She got a promotion at work.  She is doing more traveling.  For some reason, her insurance company will not let us use Aranesp.  She clearly qualifies for Aranesp.     Her last iron studies back in May showed a ferritin of 408 with an iron saturation of 39%.    Overall, her performance status is ECOG 0.  Medications:  Current Outpatient Medications:  .  aspirin 81 MG tablet, Take 81 mg by mouth daily., Disp: , Rfl:  .  atropine 1 % ophthalmic solution, PLACE 1 DROP INTO LEFT EYE TWICE A DAY, Disp: , Rfl: 3 .  BESIVANCE 0.6 % SUSP, PLACE 1 DROP INTO BOTH EYES EVERY 15 MINUTES FOR 4 HOURS THEN USE EVERY 30 MINUTES AS DIRECTED, Disp: , Rfl: 6 .  buPROPion (WELLBUTRIN XL) 150 MG 24 hr tablet, TAKE 1 TABLET BY MOUTH EVERY DAY IN THE MORNING, Disp: , Rfl: 1 .  FLUoxetine (PROZAC) 40 MG capsule, Take 40 mg by mouth daily. , Disp: , Rfl:  .  gabapentin (NEURONTIN) 300 MG capsule, Take 300 mg by mouth 3 (three) times daily. 1 in AM-  1 in PM  & 2 at  BEDTIME, Disp: , Rfl:  .  glucose blood test strip, 1 each by Other route as needed for other. Use as instructed, Disp: , Rfl:  .  hydrocortisone 2.5 % cream, Apply topically as needed. , Disp: , Rfl:  .  insulin aspart (NOVOLOG) 100 UNIT/ML injection, Inject into the skin as needed. Insulin pump, Pt bolus when she eat's, Disp: , Rfl:  .  lisinopril (PRINIVIL,ZESTRIL) 5 MG tablet, , Disp: , Rfl:  .  ONETOUCH DELICA LANCETS 33G MISC, , Disp: , Rfl:  .  prednisoLONE acetate (PRED FORTE) 1 % ophthalmic suspension, PLACE 1 DROP IN RIGHT EYE 3 TIMES DAILY, Disp: , Rfl: 1 .  PREMARIN 0.9 MG tablet, , Disp: , Rfl:  .  ranitidine (ZANTAC) 150 MG tablet, Take 150 mg by mouth 2 (two) times daily., Disp: , Rfl: 5 .  simvastatin (ZOCOR) 40 MG tablet, Take 40 mg by mouth every evening., Disp: , Rfl:  .  SYNTHROID 88 MCG tablet, Take 88 mcg by mouth daily., Disp: , Rfl:  .  ZYLET 0.5-0.3 % SUSP, SHAKE LQ AND INT 1 GTT IN OS Q 2 H WA, Disp: , Rfl: 1  Allergies:  Allergies  Allergen Reactions  . Penicillins   .  Sulfa Antibiotics     Past Medical History, Surgical history, Social history, and Family History were reviewed and updated.  Review of Systems: As stated in the interim history  Physical Exam: Well-developed and well-nourished  White female in no obvious distress.  Head exam shows no ocular or oral lesions.  There are no palpable cervical or supraclavicular lymph nodes.  Lungs are clear.  Cardiac exam regular rate and rhythm with no murmurs, rubs or bruits.  Abdomen is soft.  She has good bowel sounds.  There is no fluid wave.  She has a insulin pump on her abdominal wall.  Extremities shows no clubbing, cyanosis or edema.  Neurological exam shows no focal neurological deficits.  Skin exam shows no rashes, ecchymoses or petechia.  weight is 183 lb (83 kg). Her oral temperature is 98.2 F (36.8 C). Her blood pressure is 123/60 and her pulse is 83. Her respiration is 16 and oxygen saturation is  100%.        Lab Results  Component Value Date   WBC 5.6 12/25/2016   HGB 10.8 (L) 12/25/2016   HCT 32.7 (L) 12/25/2016   MCV 95 12/25/2016   PLT 222 12/25/2016     Chemistry      Component Value Date/Time   NA 141 12/25/2016 0758   NA 138 06/24/2016 0743   K 4.8 (H) 12/25/2016 0758   K 4.4 06/24/2016 0743   CL 102 12/25/2016 0758   CO2 27 12/25/2016 0758   CO2 28 06/24/2016 0743   BUN 23 (H) 12/25/2016 0758   BUN 20.6 06/24/2016 0743   CREATININE 1.6 (H) 12/25/2016 0758   CREATININE 1.1 06/24/2016 0743      Component Value Date/Time   CALCIUM 9.6 12/25/2016 0758   CALCIUM 9.6 06/24/2016 0743   ALKPHOS 62 12/25/2016 0758   ALKPHOS 57 06/24/2016 0743   AST 23 12/25/2016 0758   AST 19 06/24/2016 0743   ALT 18 12/25/2016 0758   ALT 20 06/24/2016 0743   BILITOT 0.40 12/25/2016 0758   BILITOT 0.34 06/24/2016 0743         Impression and Plan: Miranda Allen is 56 year-old white female with diabetes. She is insulin dependent. She has marked erythropoietin deficiency.  Her erythropoietin level is only 5.  She now is developing some renal insufficiency.  She has diabetes.  Her blood sugar today is 201.  As such, her renal insufficiency certainly could worsen.   For some reason, her insurance will not let us give Aranesp.  She definitely qualifies for Aranesp.  We will have to work on this.   We will see what her iron levels are.   I will plan to get her back in 6 months.  I am so happy that she found a full brother by DNA matching.  I am also very happy that she is now a grandmother.  She looks incredibly young and is incredibly cool for "grandmother material."     Miranda Allen,Miranda Hodsdon R, MD 11/9/20188:42 AM

## 2016-12-28 ENCOUNTER — Ambulatory Visit (HOSPITAL_BASED_OUTPATIENT_CLINIC_OR_DEPARTMENT_OTHER): Payer: Commercial Managed Care - PPO

## 2016-12-28 VITALS — BP 112/63 | HR 78 | Temp 98.4°F | Resp 16

## 2016-12-28 DIAGNOSIS — D631 Anemia in chronic kidney disease: Secondary | ICD-10-CM

## 2016-12-28 DIAGNOSIS — N189 Chronic kidney disease, unspecified: Secondary | ICD-10-CM | POA: Diagnosis not present

## 2016-12-28 MED ORDER — DARBEPOETIN ALFA 300 MCG/0.6ML IJ SOSY
PREFILLED_SYRINGE | INTRAMUSCULAR | Status: AC
  Filled 2016-12-28: qty 0.6

## 2016-12-28 MED ORDER — DARBEPOETIN ALFA 300 MCG/0.6ML IJ SOSY
300.0000 ug | PREFILLED_SYRINGE | Freq: Once | INTRAMUSCULAR | Status: AC
  Administered 2016-12-28: 300 ug via SUBCUTANEOUS

## 2017-04-28 ENCOUNTER — Encounter (INDEPENDENT_AMBULATORY_CARE_PROVIDER_SITE_OTHER): Payer: Commercial Managed Care - PPO | Admitting: Ophthalmology

## 2017-06-10 ENCOUNTER — Other Ambulatory Visit: Payer: Self-pay | Admitting: Endocrinology

## 2017-06-10 DIAGNOSIS — E049 Nontoxic goiter, unspecified: Secondary | ICD-10-CM

## 2017-06-25 ENCOUNTER — Encounter: Payer: Self-pay | Admitting: *Deleted

## 2017-06-25 ENCOUNTER — Encounter: Payer: Self-pay | Admitting: Hematology & Oncology

## 2017-06-25 ENCOUNTER — Other Ambulatory Visit: Payer: Self-pay

## 2017-06-25 ENCOUNTER — Inpatient Hospital Stay: Payer: Commercial Managed Care - PPO | Attending: Hematology & Oncology | Admitting: Hematology & Oncology

## 2017-06-25 ENCOUNTER — Inpatient Hospital Stay: Payer: Commercial Managed Care - PPO

## 2017-06-25 VITALS — BP 110/56 | HR 73 | Temp 98.2°F | Resp 20 | Wt 189.5 lb

## 2017-06-25 DIAGNOSIS — D509 Iron deficiency anemia, unspecified: Secondary | ICD-10-CM

## 2017-06-25 DIAGNOSIS — K909 Intestinal malabsorption, unspecified: Secondary | ICD-10-CM

## 2017-06-25 DIAGNOSIS — E119 Type 2 diabetes mellitus without complications: Secondary | ICD-10-CM

## 2017-06-25 DIAGNOSIS — D631 Anemia in chronic kidney disease: Secondary | ICD-10-CM

## 2017-06-25 DIAGNOSIS — N189 Chronic kidney disease, unspecified: Secondary | ICD-10-CM | POA: Diagnosis not present

## 2017-06-25 DIAGNOSIS — N183 Chronic kidney disease, stage 3 unspecified: Secondary | ICD-10-CM

## 2017-06-25 DIAGNOSIS — Z794 Long term (current) use of insulin: Secondary | ICD-10-CM | POA: Diagnosis not present

## 2017-06-25 DIAGNOSIS — Z9289 Personal history of other medical treatment: Secondary | ICD-10-CM

## 2017-06-25 LAB — IRON AND TIBC
IRON: 74 ug/dL (ref 41–142)
SATURATION RATIOS: 34 % (ref 21–57)
TIBC: 220 ug/dL — AB (ref 236–444)
UIBC: 146 ug/dL

## 2017-06-25 LAB — CMP (CANCER CENTER ONLY)
ALT: 18 U/L (ref 0–55)
AST: 21 U/L (ref 5–34)
Albumin: 4.1 g/dL (ref 3.5–5.0)
Alkaline Phosphatase: 61 U/L (ref 40–150)
Anion gap: 10 (ref 3–11)
BUN: 21 mg/dL (ref 7–26)
CHLORIDE: 102 mmol/L (ref 98–109)
CO2: 27 mmol/L (ref 22–29)
CREATININE: 1.26 mg/dL — AB (ref 0.60–1.10)
Calcium: 9.9 mg/dL (ref 8.4–10.4)
GFR, EST AFRICAN AMERICAN: 55 mL/min — AB (ref >60)
GFR, EST NON AFRICAN AMERICAN: 47 mL/min — AB (ref >60)
Glucose, Bld: 123 mg/dL (ref 70–140)
POTASSIUM: 4.6 mmol/L (ref 3.5–5.1)
SODIUM: 139 mmol/L (ref 136–145)
Total Bilirubin: 0.3 mg/dL (ref 0.2–1.2)
Total Protein: 6.6 g/dL (ref 6.4–8.3)

## 2017-06-25 LAB — CBC WITH DIFFERENTIAL (CANCER CENTER ONLY)
BASOS PCT: 0 %
Basophils Absolute: 0 10*3/uL (ref 0.0–0.1)
EOS ABS: 0.2 10*3/uL (ref 0.0–0.5)
EOS PCT: 3 %
HCT: 31.9 % — ABNORMAL LOW (ref 34.8–46.6)
HEMOGLOBIN: 10.7 g/dL — AB (ref 11.6–15.9)
Lymphocytes Relative: 18 %
Lymphs Abs: 0.9 10*3/uL (ref 0.9–3.3)
MCH: 31.3 pg (ref 26.0–34.0)
MCHC: 33.5 g/dL (ref 32.0–36.0)
MCV: 93.3 fL (ref 81.0–101.0)
Monocytes Absolute: 0.5 10*3/uL (ref 0.1–0.9)
Monocytes Relative: 11 %
NEUTROS PCT: 68 %
Neutro Abs: 3.2 10*3/uL (ref 1.5–6.5)
PLATELETS: 235 10*3/uL (ref 145–400)
RBC: 3.42 MIL/uL — AB (ref 3.70–5.32)
RDW: 12.5 % (ref 11.1–15.7)
WBC: 4.7 10*3/uL (ref 3.9–10.0)

## 2017-06-25 LAB — LACTATE DEHYDROGENASE: LDH: 174 U/L (ref 125–245)

## 2017-06-25 LAB — FERRITIN: Ferritin: 348 ng/mL — ABNORMAL HIGH (ref 9–269)

## 2017-06-25 MED ORDER — DARBEPOETIN ALFA 300 MCG/0.6ML IJ SOSY
300.0000 ug | PREFILLED_SYRINGE | Freq: Once | INTRAMUSCULAR | Status: AC
  Administered 2017-06-25: 300 ug via SUBCUTANEOUS

## 2017-06-25 MED ORDER — DARBEPOETIN ALFA 300 MCG/0.6ML IJ SOSY
PREFILLED_SYRINGE | INTRAMUSCULAR | Status: AC
  Filled 2017-06-25: qty 0.6

## 2017-06-25 NOTE — Progress Notes (Signed)
Hematology and Oncology Follow Up Visit  Miranda Allen [id number] 11-21-1960 57 y.o. 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 is back for followup. She feels good. We last saw her 6 months ago.   The big news is that she is now thinking of giving up her motorcycle riding.  I am a little bit surprised by this.  She is to ride her motorcycle all the time.  However, she just wants to travel a little bit more and have more flexibility.  Her blood sugars have been doing pretty well.  Her hemoglobin A1c is 6.8.  She is had no problems with complications from her diabetes.  She is had no problems with iron deficiency despite the Aranesp.  She did scratch her cornea earlier this year.  This was her left cornea.  This is better.  Her grandson is doing well.  Surprisingly enough, he was born the same day and my granddaughter is to be born.  She had a mammogram done a couple weeks ago.  This all appear to be okay.  She is had no fever.  She is had no problem with infections.  Overall, her performance status is ECOG 0.  Medications:  Current Outpatient Medications:  .  aspirin 81 MG tablet, Take 81 mg by mouth daily., Disp: , Rfl:  .  atropine 1 % ophthalmic solution, PLACE 1 DROP INTO LEFT EYE TWICE A DAY, Disp: , Rfl: 3 .  BESIVANCE 0.6 % SUSP, PLACE 1 DROP INTO BOTH EYES EVERY 15 MINUTES FOR 4 HOURS THEN USE EVERY 30 MINUTES AS DIRECTED, Disp: , Rfl: 6 .  buPROPion (WELLBUTRIN XL) 150 MG 24 hr tablet, TAKE 1 TABLET BY MOUTH EVERY DAY IN THE MORNING, Disp: , Rfl: 1 .  FLUoxetine (PROZAC) 40 MG capsule, Take 40 mg by mouth daily. , Disp: , Rfl:  .  gabapentin (NEURONTIN) 300 MG capsule, Take 300 mg by mouth 3 (three) times daily. 1 in AM-  1  in PM  & 2 at BEDTIME, Disp: , Rfl:  .  glucose blood test strip, 1 each by Other route as needed for other. Use as instructed, Disp: , Rfl:  .  hydrocortisone 2.5 % cream, Apply topically as needed. , Disp: , Rfl:  .  insulin aspart (NOVOLOG) 100 UNIT/ML injection, Inject into the skin as needed. Insulin pump, Pt bolus when she eat's, Disp: , Rfl:  .  lisinopril (PRINIVIL,ZESTRIL) 5 MG tablet, , Disp: , Rfl:  .  ONETOUCH DELICA LANCETS 33G MISC, , Disp: , Rfl:  .  prednisoLONE acetate (PRED FORTE) 1 % ophthalmic suspension, PLACE 1 DROP IN RIGHT EYE 3 TIMES DAILY, Disp: , Rfl: 1 .  PREMARIN 0.9 MG tablet, , Disp: , Rfl:  .  ranitidine (ZANTAC) 150 MG tablet, Take 150 mg by mouth 2 (two) times daily., Disp: , Rfl: 5 .  simvastatin (ZOCOR) 40 MG tablet, Take 40 mg by mouth every evening., Disp: , Rfl:  .  SYNTHROID 88 MCG tablet, Take 88 mcg by mouth daily., Disp: , Rfl:  .  ZYLET 0.5-0.3 % SUSP, SHAKE LQ AND INT 1 GTT IN OS Q 2 H WA, Disp: , Rfl: 1  Allergies:  Allergies  Allergen Reactions  .  Penicillins   . Sulfa Antibiotics     Past Medical History, Surgical history, Social history, and Family History were reviewed and updated.  Review of Systems: Review of Systems  Constitutional: Negative.   HENT: Negative.   Eyes: Negative.   Respiratory: Negative.   Cardiovascular: Negative.   Gastrointestinal: Negative.   Genitourinary: Negative.   Musculoskeletal: Negative.   Skin: Negative.   Neurological: Negative.   Endo/Heme/Allergies: Negative.   Psychiatric/Behavioral: Negative.      Physical Exam:  Physical Exam  Constitutional: She is oriented to person, place, and time.  HENT:  Head: Normocephalic and atraumatic.  Mouth/Throat: Oropharynx is clear and moist.  Eyes: Pupils are equal, round, and reactive to light. EOM are normal.  Neck: Normal range of motion.  Cardiovascular: Normal rate, regular rhythm and normal heart sounds.  Pulmonary/Chest: Effort normal and  breath sounds normal.  Abdominal: Soft. Bowel sounds are normal.  Musculoskeletal: Normal range of motion. She exhibits no edema, tenderness or deformity.  Lymphadenopathy:    She has no cervical adenopathy.  Neurological: She is alert and oriented to person, place, and time.  Skin: Skin is warm and dry. No rash noted. No erythema.  Psychiatric: She has a normal mood and affect. Her behavior is normal. Judgment and thought content normal.  Vitals reviewed.          weight is 189 lb 8 oz (86 kg). Her oral temperature is 98.2 F (36.8 C). Her blood pressure is 110/56 (abnormal) and her pulse is 73. Her respiration is 20 and oxygen saturation is 98%.        Lab Results  Component Value Date   WBC 4.7 06/25/2017   HGB 10.7 (L) 06/25/2017   HCT 31.9 (L) 06/25/2017   MCV 93.3 06/25/2017   PLT 235 06/25/2017     Chemistry      Component Value Date/Time   NA 141 12/25/2016 0758   NA 138 06/24/2016 0743   K 4.8 (H) 12/25/2016 0758   K 4.4 06/24/2016 0743   CL 102 12/25/2016 0758   CO2 27 12/25/2016 0758   CO2 28 06/24/2016 0743   BUN 23 (H) 12/25/2016 0758   BUN 20.6 06/24/2016 0743   CREATININE 1.6 (H) 12/25/2016 0758   CREATININE 1.1 06/24/2016 0743      Component Value Date/Time   CALCIUM 9.6 12/25/2016 0758   CALCIUM 9.6 06/24/2016 0743   ALKPHOS 62 12/25/2016 0758   ALKPHOS 57 06/24/2016 0743   AST 23 12/25/2016 0758   AST 19 06/24/2016 0743   ALT 18 12/25/2016 0758   ALT 20 06/24/2016 0743   BILITOT 0.40 12/25/2016 0758   BILITOT 0.34 06/24/2016 0743         Impression and Plan: Miranda Allen is 57 year-old white female with diabetes. She is insulin dependent. She has marked erythropoietin deficiency.  Her erythropoietin level is only 5.   We will see what her iron levels are.   I will plan to get her back in 6 months.      Josph Macho, MD 5/10/20198:17 AM

## 2017-06-25 NOTE — Patient Instructions (Signed)

## 2017-07-15 ENCOUNTER — Ambulatory Visit
Admission: RE | Admit: 2017-07-15 | Discharge: 2017-07-15 | Disposition: A | Discharge status: Home / Self Care | Payer: Commercial Managed Care - PPO | Source: Ambulatory Visit | Attending: Endocrinology | Admitting: Endocrinology

## 2017-07-15 DIAGNOSIS — E049 Nontoxic goiter, unspecified: Secondary | ICD-10-CM

## 2017-08-16 ENCOUNTER — Ambulatory Visit
Admission: RE | Admit: 2017-08-16 | Discharge: 2017-08-16 | Disposition: A | Discharge status: Home / Self Care | Payer: Commercial Managed Care - PPO | Source: Ambulatory Visit | Attending: Physician Assistant | Admitting: Physician Assistant

## 2017-08-16 ENCOUNTER — Other Ambulatory Visit: Payer: Self-pay | Admitting: Physician Assistant

## 2017-08-16 DIAGNOSIS — M25531 Pain in right wrist: Secondary | ICD-10-CM

## 2017-12-23 ENCOUNTER — Telehealth: Payer: Self-pay | Admitting: *Deleted

## 2017-12-23 ENCOUNTER — Inpatient Hospital Stay: Payer: Commercial Managed Care - PPO | Attending: Hematology & Oncology | Admitting: Hematology & Oncology

## 2017-12-23 ENCOUNTER — Other Ambulatory Visit: Payer: Self-pay

## 2017-12-23 ENCOUNTER — Inpatient Hospital Stay: Payer: Commercial Managed Care - PPO

## 2017-12-23 ENCOUNTER — Encounter: Payer: Self-pay | Admitting: Hematology & Oncology

## 2017-12-23 VITALS — BP 121/67 | HR 74 | Temp 98.5°F | Resp 18 | Wt 196.5 lb

## 2017-12-23 DIAGNOSIS — D631 Anemia in chronic kidney disease: Secondary | ICD-10-CM

## 2017-12-23 DIAGNOSIS — Z794 Long term (current) use of insulin: Secondary | ICD-10-CM | POA: Diagnosis not present

## 2017-12-23 DIAGNOSIS — N189 Chronic kidney disease, unspecified: Secondary | ICD-10-CM | POA: Insufficient documentation

## 2017-12-23 DIAGNOSIS — E119 Type 2 diabetes mellitus without complications: Secondary | ICD-10-CM

## 2017-12-23 DIAGNOSIS — D509 Iron deficiency anemia, unspecified: Secondary | ICD-10-CM | POA: Diagnosis not present

## 2017-12-23 DIAGNOSIS — D508 Other iron deficiency anemias: Secondary | ICD-10-CM

## 2017-12-23 LAB — CMP (CANCER CENTER ONLY)
ALBUMIN: 3.9 g/dL (ref 3.5–5.0)
ALK PHOS: 81 U/L (ref 38–126)
ALT: 12 U/L (ref 0–44)
ANION GAP: 8 (ref 5–15)
AST: 15 U/L (ref 15–41)
BUN: 21 mg/dL — ABNORMAL HIGH (ref 6–20)
CHLORIDE: 101 mmol/L (ref 98–111)
CO2: 25 mmol/L (ref 22–32)
Calcium: 9.7 mg/dL (ref 8.9–10.3)
Creatinine: 1.18 mg/dL — ABNORMAL HIGH (ref 0.44–1.00)
GFR, Est AFR Am: 59 mL/min — ABNORMAL LOW (ref >60)
GFR, Estimated: 51 mL/min — ABNORMAL LOW (ref >60)
GLUCOSE: 189 mg/dL — AB (ref 70–99)
POTASSIUM: 4.9 mmol/L (ref 3.5–5.1)
SODIUM: 134 mmol/L — AB (ref 135–145)
Total Bilirubin: 0.3 mg/dL (ref 0.3–1.2)
Total Protein: 6.6 g/dL (ref 6.5–8.1)

## 2017-12-23 LAB — CBC WITH DIFFERENTIAL (CANCER CENTER ONLY)
Abs Immature Granulocytes: 0.02 10*3/uL (ref 0.00–0.07)
BASOS ABS: 0 10*3/uL (ref 0.0–0.1)
Basophils Relative: 1 %
EOS PCT: 6 %
Eosinophils Absolute: 0.3 10*3/uL (ref 0.0–0.5)
HCT: 32.3 % — ABNORMAL LOW (ref 36.0–46.0)
HEMOGLOBIN: 10 g/dL — AB (ref 12.0–15.0)
Immature Granulocytes: 0 %
LYMPHS PCT: 17 %
Lymphs Abs: 1 10*3/uL (ref 0.7–4.0)
MCH: 29.2 pg (ref 26.0–34.0)
MCHC: 31 g/dL (ref 30.0–36.0)
MCV: 94.2 fL (ref 80.0–100.0)
Monocytes Absolute: 0.5 10*3/uL (ref 0.1–1.0)
Monocytes Relative: 9 %
NRBC: 0 % (ref 0.0–0.2)
Neutro Abs: 3.8 10*3/uL (ref 1.7–7.7)
Neutrophils Relative %: 67 %
Platelet Count: 226 10*3/uL (ref 150–400)
RBC: 3.43 MIL/uL — AB (ref 3.87–5.11)
RDW: 12.2 % (ref 11.5–15.5)
WBC Count: 5.6 10*3/uL (ref 4.0–10.5)

## 2017-12-23 LAB — RETICULOCYTES
Immature Retic Fract: 6.4 % (ref 2.3–15.9)
RBC.: 3.43 MIL/uL — ABNORMAL LOW (ref 3.87–5.11)
RETIC CT PCT: 1.1 % (ref 0.4–3.1)
Retic Count, Absolute: 38.8 10*3/uL (ref 19.0–186.0)

## 2017-12-23 LAB — IRON AND TIBC
IRON: 81 ug/dL (ref 41–142)
Saturation Ratios: 34 % (ref 21–57)
TIBC: 236 ug/dL (ref 236–444)
UIBC: 156 ug/dL (ref 120–384)

## 2017-12-23 LAB — FERRITIN: Ferritin: 338 ng/mL — ABNORMAL HIGH (ref 11–307)

## 2017-12-23 MED ORDER — DARBEPOETIN ALFA 300 MCG/0.6ML IJ SOSY
PREFILLED_SYRINGE | INTRAMUSCULAR | Status: AC
  Filled 2017-12-23: qty 0.6

## 2017-12-23 MED ORDER — DARBEPOETIN ALFA 300 MCG/0.6ML IJ SOSY
300.0000 ug | PREFILLED_SYRINGE | Freq: Once | INTRAMUSCULAR | Status: AC
  Administered 2017-12-23: 300 ug via SUBCUTANEOUS

## 2017-12-23 NOTE — Telephone Encounter (Signed)
As noted below by Dr. Ennever, I informed patient of her iron level. She verbalized understanding. 

## 2017-12-23 NOTE — Progress Notes (Signed)
Hematology and Oncology Follow Up Visit  Miranda Allen [id number] 01-25-1961 56 y.o. 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 is back for followup. She feels good. We last saw her 6 months ago.  So far, everything is going pretty well with her.  She is little upset that her husband has 2 weeks out in [place] and that she cannot go because of work.  She is busy at work.  She does travel with her job.  She is doing well with her diabetes.  She has an insulin pump.  She said her last hemoglobin A1c was 6.5.  We saw her back in May, her iron studies looked okay.  Her ferritin was 348 with an iron saturation of 34%.  She is had no infection.  There is been no fever.  She is had no change in bowel or bladder habits.  She is had no cough or shortness of breath.  There is been no rashes.  She is had no leg swelling.  Overall, her performance status is ECOG 0.  Medications:  Current Outpatient Medications:  .  aspirin 81 MG tablet, Take 81 mg by mouth daily., Disp: , Rfl:  .  buPROPion (WELLBUTRIN XL) 150 MG 24 hr tablet, Take 150 mg by mouth daily. Take 1 tablet by mouth daily in the morning., Disp: , Rfl:  .  gabapentin (NEURONTIN) 300 MG capsule, Take 300 mg by mouth 3 (three) times daily. 1 in AM-  1 in PM  & 2 at BEDTIME, Disp: , Rfl:  .  glucose blood test strip, 1 each by Other route as needed for other. Use as instructed, Disp: , Rfl:  .  hydrocortisone 2.5 % cream, Apply topically as needed. , Disp: , Rfl:  .  insulin aspart (NOVOLOG) 100 UNIT/ML injection, Inject into the skin as needed. Insulin pump, Pt bolus when she eat's, Disp: , Rfl:  .  lisinopril (PRINIVIL,ZESTRIL) 5 MG tablet, , Disp: , Rfl:  .  ONETOUCH DELICA LANCETS 33G MISC,  , Disp: , Rfl:  .  simvastatin (ZOCOR) 40 MG tablet, Take 40 mg by mouth daily., Disp: , Rfl:  .  SYNTHROID 88 MCG tablet, Take 88 mcg by mouth daily., Disp: , Rfl:  .  FLUoxetine (PROZAC) 40 MG capsule, Take 40 mg by mouth daily. , Disp: , Rfl:   Allergies:  Allergies  Allergen Reactions  . Penicillins Other (See Comments), Itching, Rash, Swelling and Hives  . Sulfa Antibiotics Rash and Hives    don't recall reaction     Past Medical History, Surgical history, Social history, and Family History were reviewed and updated.  Review of Systems: Review of Systems  Constitutional: Negative.   HENT: Negative.   Eyes: Negative.   Respiratory: Negative.   Cardiovascular: Negative.   Gastrointestinal: Negative.   Genitourinary: Negative.   Musculoskeletal: Negative.   Skin: Negative.   Neurological: Negative.   Endo/Heme/Allergies: Negative.   Psychiatric/Behavioral: Negative.      Physical Exam:  Physical Exam  Constitutional: She is oriented to person, place, and time.  HENT:  Head: Normocephalic and atraumatic.  Mouth/Throat: Oropharynx is clear and moist.  Eyes: Pupils are equal, round, and reactive to light. EOM are normal.  Neck: Normal  range of motion.  Cardiovascular: Normal rate, regular rhythm and normal heart sounds.  Pulmonary/Chest: Effort normal and breath sounds normal.  Abdominal: Soft. Bowel sounds are normal.  Musculoskeletal: Normal range of motion. She exhibits no edema, tenderness or deformity.  Lymphadenopathy:    She has no cervical adenopathy.  Neurological: She is alert and oriented to person, place, and time.  Skin: Skin is warm and dry. No rash noted. No erythema.  Psychiatric: She has a normal mood and affect. Her behavior is normal. Judgment and thought content normal.  Vitals reviewed.          weight is 196 lb 8 oz (89.1 kg). Her oral temperature is 98.5 F (36.9 C). Her blood pressure is 121/67 and her pulse is 74. Her respiration is  18 and oxygen saturation is 100%.        Lab Results  Component Value Date   WBC 5.6 12/23/2017   HGB 10.0 (L) 12/23/2017   HCT 32.3 (L) 12/23/2017   MCV 94.2 12/23/2017   PLT 226 12/23/2017     Chemistry      Component Value Date/Time   NA 139 06/25/2017 0746   NA 141 12/25/2016 0758   NA 138 06/24/2016 0743   K 4.6 06/25/2017 0746   K 4.8 (H) 12/25/2016 0758   K 4.4 06/24/2016 0743   CL 102 06/25/2017 0746   CL 102 12/25/2016 0758   CO2 27 06/25/2017 0746   CO2 27 12/25/2016 0758   CO2 28 06/24/2016 0743   BUN 21 06/25/2017 0746   BUN 23 (H) 12/25/2016 0758   BUN 20.6 06/24/2016 0743   CREATININE 1.26 (H) 06/25/2017 0746   CREATININE 1.6 (H) 12/25/2016 0758   CREATININE 1.1 06/24/2016 0743      Component Value Date/Time   CALCIUM 9.9 06/25/2017 0746   CALCIUM 9.6 12/25/2016 0758   CALCIUM 9.6 06/24/2016 0743   ALKPHOS 61 06/25/2017 0746   ALKPHOS 62 12/25/2016 0758   ALKPHOS 57 06/24/2016 0743   AST 21 06/25/2017 0746   AST 19 06/24/2016 0743   ALT 18 06/25/2017 0746   ALT 18 12/25/2016 0758   ALT 20 06/24/2016 0743   BILITOT 0.3 06/25/2017 0746   BILITOT 0.34 06/24/2016 0743         Impression and Plan: Miranda Allen is 57 year-old white female with diabetes. She is insulin dependent. She has marked erythropoietin deficiency.  Her erythropoietin level is only 5.  She will get a dose of Aranesp today.  We will see what her iron studies look like.  We will still plan to get her back in 6 months.    Miranda Macho, MD 11/7/20199:36 AM

## 2017-12-23 NOTE — Telephone Encounter (Signed)
-----   Message from Josph Macho, MD sent at 12/23/2017  1:28 PM EST ----- Call - iron level is ok!!  Cindee Lame

## 2018-06-20 ENCOUNTER — Other Ambulatory Visit: Payer: Self-pay | Admitting: Nephrology

## 2018-06-20 DIAGNOSIS — N183 Chronic kidney disease, stage 3 unspecified: Secondary | ICD-10-CM

## 2018-06-20 DIAGNOSIS — I129 Hypertensive chronic kidney disease with stage 1 through stage 4 chronic kidney disease, or unspecified chronic kidney disease: Secondary | ICD-10-CM

## 2018-06-23 ENCOUNTER — Other Ambulatory Visit: Payer: Self-pay

## 2018-06-23 ENCOUNTER — Inpatient Hospital Stay: Payer: Commercial Managed Care - PPO

## 2018-06-23 ENCOUNTER — Inpatient Hospital Stay: Payer: Commercial Managed Care - PPO | Attending: Hematology & Oncology | Admitting: Hematology & Oncology

## 2018-06-23 ENCOUNTER — Encounter: Payer: Self-pay | Admitting: Hematology & Oncology

## 2018-06-23 VITALS — BP 126/63 | HR 77 | Temp 98.1°F | Resp 18 | Wt 197.0 lb

## 2018-06-23 DIAGNOSIS — K909 Intestinal malabsorption, unspecified: Secondary | ICD-10-CM | POA: Insufficient documentation

## 2018-06-23 DIAGNOSIS — E1122 Type 2 diabetes mellitus with diabetic chronic kidney disease: Secondary | ICD-10-CM | POA: Insufficient documentation

## 2018-06-23 DIAGNOSIS — Z794 Long term (current) use of insulin: Secondary | ICD-10-CM | POA: Diagnosis not present

## 2018-06-23 DIAGNOSIS — N189 Chronic kidney disease, unspecified: Secondary | ICD-10-CM

## 2018-06-23 DIAGNOSIS — Z9641 Presence of insulin pump (external) (internal): Secondary | ICD-10-CM | POA: Insufficient documentation

## 2018-06-23 DIAGNOSIS — Z880 Allergy status to penicillin: Secondary | ICD-10-CM | POA: Diagnosis not present

## 2018-06-23 DIAGNOSIS — E611 Iron deficiency: Secondary | ICD-10-CM | POA: Insufficient documentation

## 2018-06-23 DIAGNOSIS — Z862 Personal history of diseases of the blood and blood-forming organs and certain disorders involving the immune mechanism: Secondary | ICD-10-CM | POA: Insufficient documentation

## 2018-06-23 DIAGNOSIS — D631 Anemia in chronic kidney disease: Secondary | ICD-10-CM | POA: Insufficient documentation

## 2018-06-23 DIAGNOSIS — Z882 Allergy status to sulfonamides status: Secondary | ICD-10-CM | POA: Diagnosis not present

## 2018-06-23 DIAGNOSIS — D508 Other iron deficiency anemias: Secondary | ICD-10-CM

## 2018-06-23 DIAGNOSIS — N183 Chronic kidney disease, stage 3 (moderate): Secondary | ICD-10-CM | POA: Diagnosis not present

## 2018-06-23 LAB — CMP (CANCER CENTER ONLY)
ALT: 11 U/L (ref 0–44)
AST: 13 U/L — ABNORMAL LOW (ref 15–41)
Albumin: 4.2 g/dL (ref 3.5–5.0)
Alkaline Phosphatase: 70 U/L (ref 38–126)
Anion gap: 8 (ref 5–15)
BUN: 25 mg/dL — ABNORMAL HIGH (ref 6–20)
CO2: 29 mmol/L (ref 22–32)
Calcium: 9.9 mg/dL (ref 8.9–10.3)
Chloride: 99 mmol/L (ref 98–111)
Creatinine: 1.35 mg/dL — ABNORMAL HIGH (ref 0.44–1.00)
GFR, Est AFR Am: 51 mL/min — ABNORMAL LOW (ref >60)
GFR, Estimated: 44 mL/min — ABNORMAL LOW (ref >60)
Glucose, Bld: 176 mg/dL — ABNORMAL HIGH (ref 70–99)
Potassium: 4.8 mmol/L (ref 3.5–5.1)
Sodium: 136 mmol/L (ref 135–145)
Total Bilirubin: 0.4 mg/dL (ref 0.3–1.2)
Total Protein: 6.5 g/dL (ref 6.5–8.1)

## 2018-06-23 LAB — CBC WITH DIFFERENTIAL (CANCER CENTER ONLY)
Abs Immature Granulocytes: 0.03 10*3/uL (ref 0.00–0.07)
Basophils Absolute: 0.1 10*3/uL (ref 0.0–0.1)
Basophils Relative: 1 %
Eosinophils Absolute: 0.2 10*3/uL (ref 0.0–0.5)
Eosinophils Relative: 3 %
HCT: 32.2 % — ABNORMAL LOW (ref 36.0–46.0)
Hemoglobin: 10.5 g/dL — ABNORMAL LOW (ref 12.0–15.0)
Immature Granulocytes: 0 %
Lymphocytes Relative: 18 %
Lymphs Abs: 1.3 10*3/uL (ref 0.7–4.0)
MCH: 30.3 pg (ref 26.0–34.0)
MCHC: 32.6 g/dL (ref 30.0–36.0)
MCV: 92.8 fL (ref 80.0–100.0)
Monocytes Absolute: 0.7 10*3/uL (ref 0.1–1.0)
Monocytes Relative: 10 %
Neutro Abs: 4.7 10*3/uL (ref 1.7–7.7)
Neutrophils Relative %: 68 %
Platelet Count: 261 10*3/uL (ref 150–400)
RBC: 3.47 MIL/uL — ABNORMAL LOW (ref 3.87–5.11)
RDW: 12.2 % (ref 11.5–15.5)
WBC Count: 7 10*3/uL (ref 4.0–10.5)
nRBC: 0 % (ref 0.0–0.2)

## 2018-06-23 LAB — IRON AND TIBC
Iron: 74 ug/dL (ref 41–142)
Saturation Ratios: 30 % (ref 21–57)
TIBC: 249 ug/dL (ref 236–444)
UIBC: 175 ug/dL (ref 120–384)

## 2018-06-23 LAB — RETICULOCYTES
Immature Retic Fract: 8.5 % (ref 2.3–15.9)
RBC.: 3.48 MIL/uL — ABNORMAL LOW (ref 3.87–5.11)
Retic Count, Absolute: 44.2 10*3/uL (ref 19.0–186.0)
Retic Ct Pct: 1.3 % (ref 0.4–3.1)

## 2018-06-23 LAB — FERRITIN: Ferritin: 313 ng/mL — ABNORMAL HIGH (ref 11–307)

## 2018-06-23 MED ORDER — DARBEPOETIN ALFA 300 MCG/0.6ML IJ SOSY
PREFILLED_SYRINGE | INTRAMUSCULAR | Status: AC
  Filled 2018-06-23: qty 0.6

## 2018-06-23 MED ORDER — DARBEPOETIN ALFA 300 MCG/0.6ML IJ SOSY
300.0000 ug | PREFILLED_SYRINGE | Freq: Once | INTRAMUSCULAR | Status: AC
  Administered 2018-06-23: 300 ug via SUBCUTANEOUS

## 2018-06-23 NOTE — Progress Notes (Signed)
Hematology and Oncology Follow Up Visit  Miranda Allen [id number] 10-01-1960 58 y.o. 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 is back for followup. She feels good. We last saw her 6 months ago.  So far, everything is going pretty well with her.   She is very excited [place] [employer] [place] [employer] [place] the [employer] this past year.  [place]  She and I talked a lot about this today.  She is busy at work.  She does travel with her job.  She is doing well with her diabetes.  She has an insulin pump.  She said her last hemoglobin A1c was 6.8.  We saw her back in November, her iron studies looked okay.  Her ferritin was 338 with an iron saturation of 34%.  She is had no infection.  There is been no fever.  She is had no change in bowel or bladder habits.  She is had no cough or shortness of breath.  There is been no rashes.  She is had no leg swelling.  Thankfully, she is still able to work.  She is still quite busy.  She is not been able to travel however.  Overall, her performance status is ECOG 0.  Medications:  Current Outpatient Medications:  .  amLODipine (NORVASC) 5 MG tablet, Take 5 mg by mouth daily., Disp: , Rfl:  .  aspirin 81 MG tablet, Take 81 mg by mouth daily., Disp: , Rfl:  .  buPROPion (WELLBUTRIN XL) 150 MG 24 hr tablet, Take 150 mg by mouth daily. Take 1 tablet by mouth daily in the morning., Disp: , Rfl:  .  FLUoxetine (PROZAC) 40 MG capsule, Take 40 mg by mouth daily. , Disp: , Rfl:  .  gabapentin (NEURONTIN) 300 MG capsule, Take 300 mg by mouth 3 (three) times daily. 1 in AM-  1 in PM  & 2 at BEDTIME, Disp: , Rfl:  .  glucose blood test strip, 1 each by Other route as needed for other. Use as instructed,  Disp: , Rfl:  .  insulin aspart (NOVOLOG) 100 UNIT/ML injection, Inject into the skin as needed. Insulin pump, Pt bolus when she eat's, Disp: , Rfl:  .  ONETOUCH DELICA LANCETS 33G MISC, , Disp: , Rfl:  .  simvastatin (ZOCOR) 40 MG tablet, Take 40 mg by mouth daily., Disp: , Rfl:  .  SYNTHROID 88 MCG tablet, Take 88 mcg by mouth daily., Disp: , Rfl:   Allergies:  Allergies  Allergen Reactions  . Penicillins Other (See Comments), Itching, Rash, Swelling and Hives  . Sulfa Antibiotics Rash and Hives    don't recall reaction   . Other Other (See Comments)    Past Medical History, Surgical history, Social history, and Family History were reviewed and updated.  Review of Systems: Review of Systems  Constitutional: Negative.   HENT: Negative.   Eyes: Negative.   Respiratory: Negative.   Cardiovascular: Negative.   Gastrointestinal: Negative.   Genitourinary: Negative.   Musculoskeletal: Negative.   Skin: Negative.   Neurological: Negative.   Endo/Heme/Allergies: Negative.   Psychiatric/Behavioral: Negative.      Physical Exam:  Physical Exam Vitals signs reviewed.  HENT:  Head: Normocephalic and atraumatic.  Eyes:     Pupils: Pupils are equal, round, and reactive to light.  Neck:     Musculoskeletal: Normal range of motion.  Cardiovascular:     Rate and Rhythm: Normal rate and regular rhythm.     Heart sounds: Normal heart sounds.  Pulmonary:     Effort: Pulmonary effort is normal.     Breath sounds: Normal breath sounds.  Abdominal:     General: Bowel sounds are normal.     Palpations: Abdomen is soft.  Musculoskeletal: Normal range of motion.        General: No tenderness or deformity.  Lymphadenopathy:     Cervical: No cervical adenopathy.  Skin:    General: Skin is warm and dry.     Findings: No erythema or rash.  Neurological:     Mental Status: She is alert and oriented to person, place, and time.  Psychiatric:        Behavior: Behavior normal.         Thought Content: Thought content normal.        Judgment: Judgment normal.            weight is 197 lb (89.4 kg). Her oral temperature is 98.1 F (36.7 C). Her blood pressure is 126/63 and her pulse is 77. Her respiration is 18 and oxygen saturation is 100%.        Lab Results  Component Value Date   WBC 7.0 06/23/2018   HGB 10.5 (L) 06/23/2018   HCT 32.2 (L) 06/23/2018   MCV 92.8 06/23/2018   PLT 261 06/23/2018     Chemistry      Component Value Date/Time   NA 136 06/23/2018 0738   NA 141 12/25/2016 0758   NA 138 06/24/2016 0743   K 4.8 06/23/2018 0738   K 4.8 (H) 12/25/2016 0758   K 4.4 06/24/2016 0743   CL 99 06/23/2018 0738   CL 102 12/25/2016 0758   CO2 29 06/23/2018 0738   CO2 27 12/25/2016 0758   CO2 28 06/24/2016 0743   BUN 25 (H) 06/23/2018 0738   BUN 23 (H) 12/25/2016 0758   BUN 20.6 06/24/2016 0743   CREATININE 1.35 (H) 06/23/2018 0738   CREATININE 1.6 (H) 12/25/2016 0758   CREATININE 1.1 06/24/2016 0743      Component Value Date/Time   CALCIUM 9.9 06/23/2018 0738   CALCIUM 9.6 12/25/2016 0758   CALCIUM 9.6 06/24/2016 0743   ALKPHOS 70 06/23/2018 0738   ALKPHOS 62 12/25/2016 0758   ALKPHOS 57 06/24/2016 0743   AST 13 (L) 06/23/2018 0738   AST 19 06/24/2016 0743   ALT 11 06/23/2018 0738   ALT 18 12/25/2016 0758   ALT 20 06/24/2016 0743   BILITOT 0.4 06/23/2018 0738   BILITOT 0.34 06/24/2016 0743         Impression and Plan: Miranda Allen is 58 year-old white female with diabetes. She is insulin dependent. She has marked erythropoietin deficiency.  Her erythropoietin level is only 5.  She will get a dose of Aranesp today.  We will see what her iron studies look like.  We will still plan to get her back in 6 months.  Hopefully, we will not have to wear any mask.  Of note, she is still riding her motorcycle.  She was out last week and had a great time riding.    Josph Macho, MD 5/7/20208:23 AM

## 2018-06-23 NOTE — Patient Instructions (Signed)

## 2018-06-24 ENCOUNTER — Encounter: Payer: Self-pay | Admitting: *Deleted

## 2018-06-29 ENCOUNTER — Ambulatory Visit
Admission: RE | Admit: 2018-06-29 | Discharge: 2018-06-29 | Disposition: A | Discharge status: Home / Self Care | Payer: Commercial Managed Care - PPO | Source: Ambulatory Visit | Attending: Nephrology | Admitting: Nephrology

## 2018-06-29 DIAGNOSIS — N183 Chronic kidney disease, stage 3 unspecified: Secondary | ICD-10-CM

## 2018-06-29 DIAGNOSIS — I129 Hypertensive chronic kidney disease with stage 1 through stage 4 chronic kidney disease, or unspecified chronic kidney disease: Secondary | ICD-10-CM

## 2018-10-10 ENCOUNTER — Other Ambulatory Visit: Payer: Self-pay | Admitting: Nephrology

## 2018-10-10 DIAGNOSIS — M7989 Other specified soft tissue disorders: Secondary | ICD-10-CM

## 2018-10-13 ENCOUNTER — Ambulatory Visit
Admission: RE | Admit: 2018-10-13 | Discharge: 2018-10-13 | Disposition: A | Discharge status: Home / Self Care | Payer: Commercial Managed Care - PPO | Source: Ambulatory Visit | Attending: Nephrology | Admitting: Nephrology

## 2018-10-13 DIAGNOSIS — M7989 Other specified soft tissue disorders: Secondary | ICD-10-CM

## 2018-11-28 IMAGING — CR DG WRIST COMPLETE 3+V*R*
4 series · 4 of 4 positions shown · non-contrast
Comparison: None.

CLINICAL DATA: 56-year-old female with 1 month of right wrist pain.
Pain maximal along thumb basal joint, scaphoid and radial styloid.
No known injury. Symptoms exacerbated by occupation.

EXAM:
RIGHT WRIST - COMPLETE 3+ VIEW

[x wrist pa right]
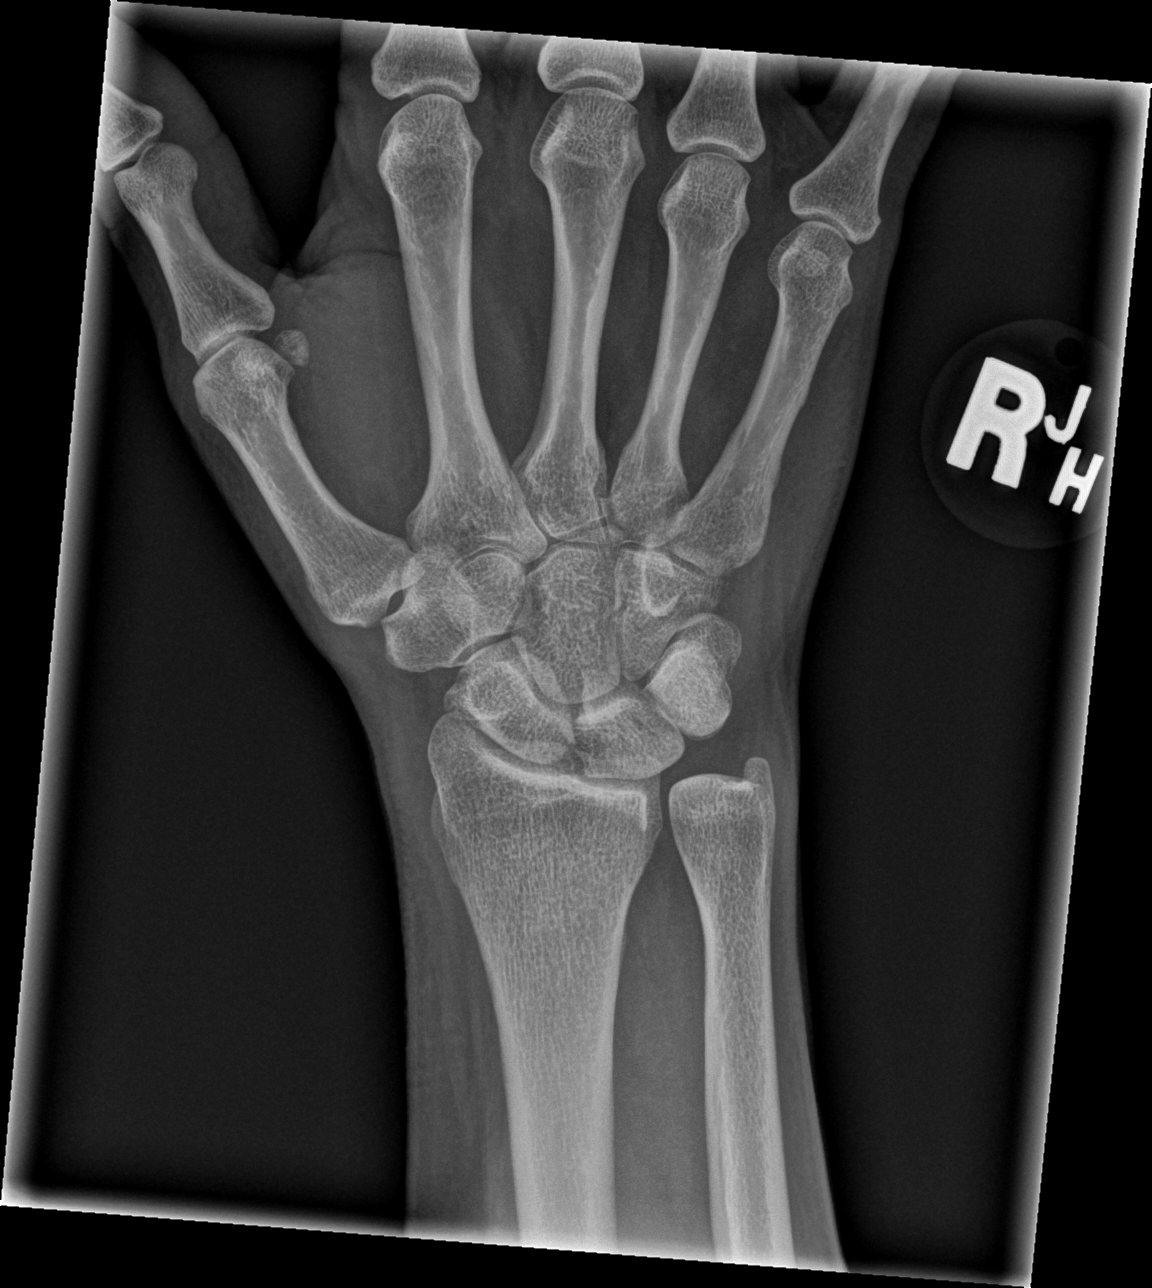

[x wrist navicular view right]
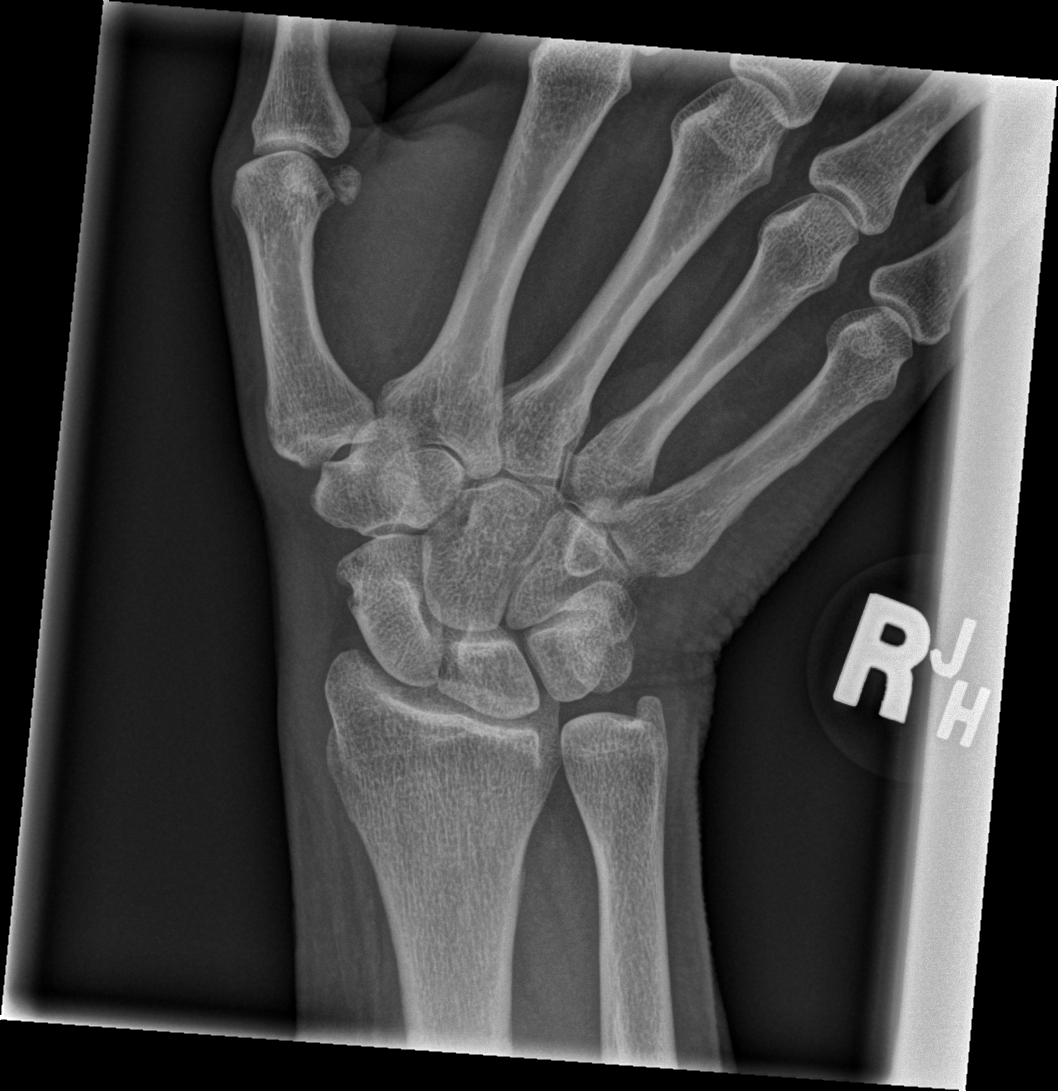

[x wrist obl right]
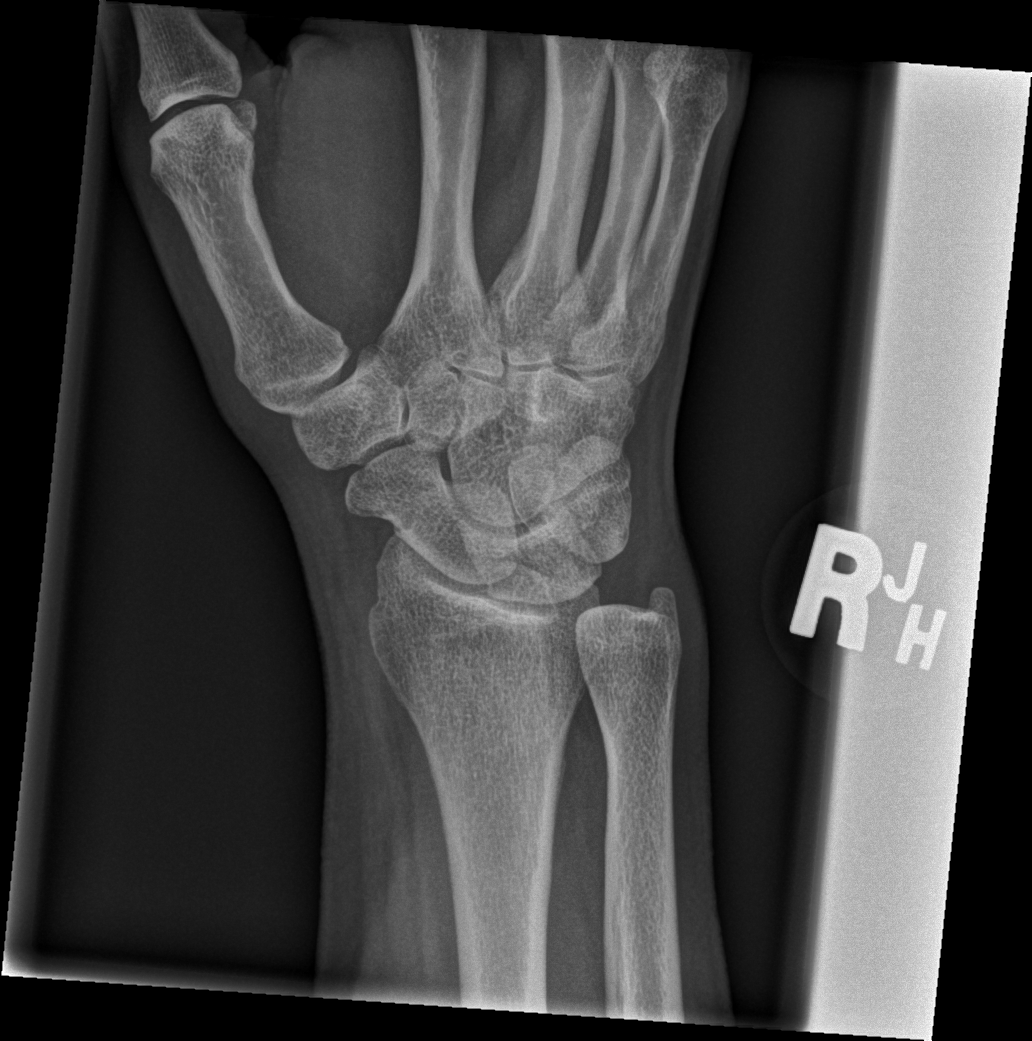

[x wrist lat right]
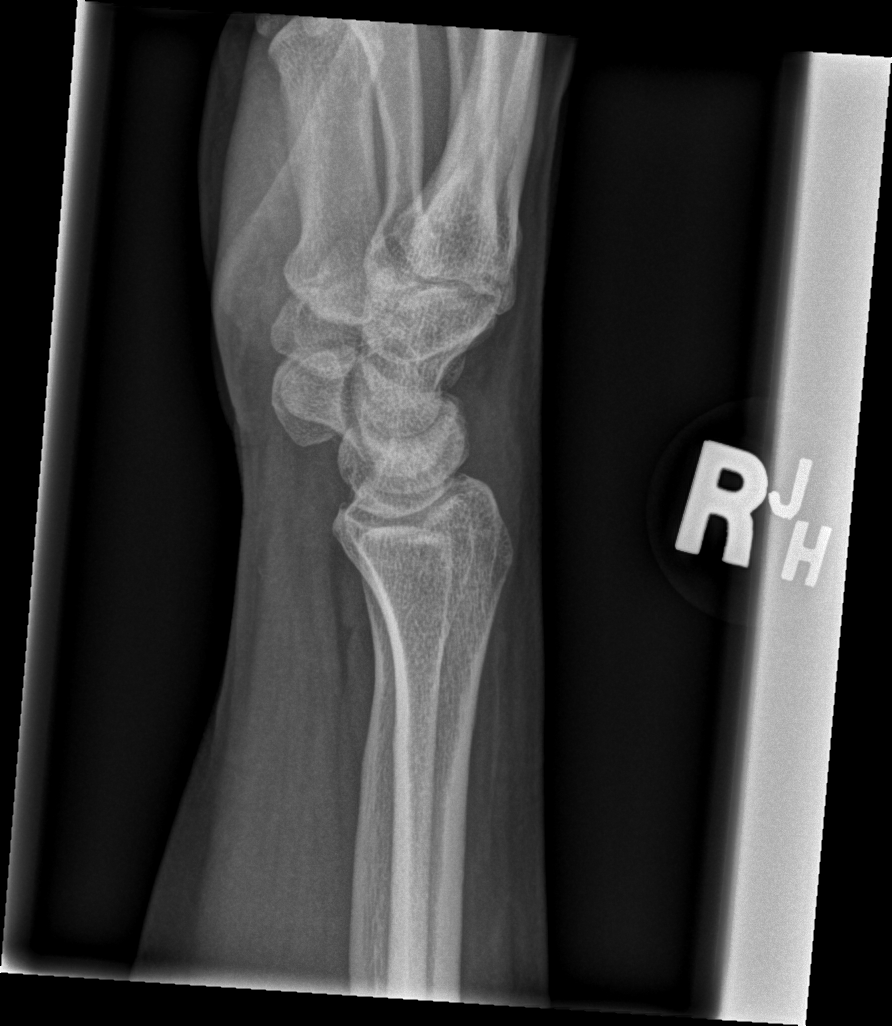

[4 of 4 positions shown; findings below may reference images not displayed]

FINDINGS: Intact distal radius and ulna. Carpal bone alignment and joint
spaces are within normal limits. Preserved right thumb basal joint
space and mineralization. No fracture or dislocation identified.
However, there is heterogeneous bone mineralization in the distal
scaphoid (arrows) with subchondral lucency. Normal mineralization
elsewhere.
IMPRESSION: No fracture or dislocation, but heterogeneous bone mineralization in
the distal scaphoid might reflect subchondral injury or less likely
erosion.
Evaluation by noncontrast MRI of the right wrist may be most
valuable if symptoms do not resolve with conservative treatment.

## 2018-12-13 ENCOUNTER — Inpatient Hospital Stay: Payer: Commercial Managed Care - PPO

## 2018-12-13 ENCOUNTER — Encounter: Payer: Self-pay | Admitting: Hematology & Oncology

## 2018-12-13 ENCOUNTER — Inpatient Hospital Stay: Payer: Commercial Managed Care - PPO | Attending: Hematology & Oncology

## 2018-12-13 ENCOUNTER — Inpatient Hospital Stay (HOSPITAL_BASED_OUTPATIENT_CLINIC_OR_DEPARTMENT_OTHER): Payer: Commercial Managed Care - PPO | Admitting: Hematology & Oncology

## 2018-12-13 ENCOUNTER — Other Ambulatory Visit: Payer: Self-pay

## 2018-12-13 VITALS — BP 131/75 | HR 82 | Temp 97.1°F | Resp 20 | Wt 204.4 lb

## 2018-12-13 DIAGNOSIS — N189 Chronic kidney disease, unspecified: Secondary | ICD-10-CM

## 2018-12-13 DIAGNOSIS — D631 Anemia in chronic kidney disease: Secondary | ICD-10-CM

## 2018-12-13 DIAGNOSIS — E119 Type 2 diabetes mellitus without complications: Secondary | ICD-10-CM | POA: Insufficient documentation

## 2018-12-13 DIAGNOSIS — Z794 Long term (current) use of insulin: Secondary | ICD-10-CM | POA: Insufficient documentation

## 2018-12-13 DIAGNOSIS — E1122 Type 2 diabetes mellitus with diabetic chronic kidney disease: Secondary | ICD-10-CM | POA: Diagnosis not present

## 2018-12-13 DIAGNOSIS — D508 Other iron deficiency anemias: Secondary | ICD-10-CM

## 2018-12-13 LAB — CBC WITH DIFFERENTIAL (CANCER CENTER ONLY)
Abs Immature Granulocytes: 0.01 10*3/uL (ref 0.00–0.07)
Basophils Absolute: 0 10*3/uL (ref 0.0–0.1)
Basophils Relative: 0 %
Eosinophils Absolute: 0 10*3/uL (ref 0.0–0.5)
Eosinophils Relative: 0 %
HCT: 32.1 % — ABNORMAL LOW (ref 36.0–46.0)
Hemoglobin: 10.3 g/dL — ABNORMAL LOW (ref 12.0–15.0)
Immature Granulocytes: 0 %
Lymphocytes Relative: 24 %
Lymphs Abs: 1.3 10*3/uL (ref 0.7–4.0)
MCH: 29.7 pg (ref 26.0–34.0)
MCHC: 32.1 g/dL (ref 30.0–36.0)
MCV: 92.5 fL (ref 80.0–100.0)
Monocytes Absolute: 0.5 10*3/uL (ref 0.1–1.0)
Monocytes Relative: 10 %
Neutro Abs: 3.6 10*3/uL (ref 1.7–7.7)
Neutrophils Relative %: 66 %
Platelet Count: 248 10*3/uL (ref 150–400)
RBC: 3.47 MIL/uL — ABNORMAL LOW (ref 3.87–5.11)
RDW: 12.7 % (ref 11.5–15.5)
WBC Count: 5.5 10*3/uL (ref 4.0–10.5)
nRBC: 0 % (ref 0.0–0.2)

## 2018-12-13 LAB — CMP (CANCER CENTER ONLY)
ALT: 10 U/L (ref 0–44)
AST: 11 U/L — ABNORMAL LOW (ref 15–41)
Albumin: 4.2 g/dL (ref 3.5–5.0)
Alkaline Phosphatase: 63 U/L (ref 38–126)
Anion gap: 4 — ABNORMAL LOW (ref 5–15)
BUN: 26 mg/dL — ABNORMAL HIGH (ref 6–20)
CO2: 29 mmol/L (ref 22–32)
Calcium: 9.6 mg/dL (ref 8.9–10.3)
Chloride: 102 mmol/L (ref 98–111)
Creatinine: 1.17 mg/dL — ABNORMAL HIGH (ref 0.44–1.00)
GFR, Est AFR Am: >60 mL/min (ref >60)
GFR, Estimated: 52 mL/min — ABNORMAL LOW (ref >60)
Glucose, Bld: 115 mg/dL — ABNORMAL HIGH (ref 70–99)
Potassium: 6 mmol/L — ABNORMAL HIGH (ref 3.5–5.1)
Sodium: 135 mmol/L (ref 135–145)
Total Bilirubin: 0.3 mg/dL (ref 0.3–1.2)
Total Protein: 6.3 g/dL — ABNORMAL LOW (ref 6.5–8.1)

## 2018-12-13 LAB — FERRITIN: Ferritin: 333 ng/mL — ABNORMAL HIGH (ref 11–307)

## 2018-12-13 LAB — RETICULOCYTES
Immature Retic Fract: 6.1 % (ref 2.3–15.9)
RBC.: 3.45 MIL/uL — ABNORMAL LOW (ref 3.87–5.11)
Retic Count, Absolute: 31.4 10*3/uL (ref 19.0–186.0)
Retic Ct Pct: 0.9 % (ref 0.4–3.1)

## 2018-12-13 LAB — IRON AND TIBC
Iron: 101 ug/dL (ref 41–142)
Saturation Ratios: 41 % (ref 21–57)
TIBC: 250 ug/dL (ref 236–444)
UIBC: 149 ug/dL (ref 120–384)

## 2018-12-13 MED ORDER — DARBEPOETIN ALFA 300 MCG/0.6ML IJ SOSY
300.0000 ug | PREFILLED_SYRINGE | Freq: Once | INTRAMUSCULAR | Status: AC
  Administered 2018-12-13: 300 ug via SUBCUTANEOUS

## 2018-12-13 MED ORDER — DARBEPOETIN ALFA 300 MCG/0.6ML IJ SOSY
PREFILLED_SYRINGE | INTRAMUSCULAR | Status: AC
  Filled 2018-12-13: qty 0.6

## 2018-12-13 NOTE — Progress Notes (Signed)
Hematology and Oncology Follow Up Visit  Miranda MaplesCheryl Allen [id number] 07-29-1960 58 y.o. 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 is back for followup.  There has been a lot going on in her life.  The real bad news is that her company's computer system has been hacked.  Is now being held for "ransom".  She has not been able to go back to work until the computer system is operational.  Her husband left the [employer].  He wants to him work [place] in [place].  He is looking for a job down the right now.  As such, it is conceivable that [name] might move to [place].  Her blood sugars are doing quite well.  She has had her last hemoglobin A1c was 6.8.  When we last saw her in May, her iron studies showed a ferritin of 313 with an iron saturation of 30%.  She recently had a mammogram.  Everything looked fine from what she says..  She has not had any neuropathy.  There is been no change in bowel or bladder habits.  She has had no nausea or vomiting.  She is expecting her second grandchild in May.  Overall, her performance status is ECOG 1.    Medications:  Current Outpatient Medications:  .  aspirin 81 MG tablet, Take 81 mg by mouth daily., Disp: , Rfl:  .  gabapentin (NEURONTIN) 300 MG capsule, Take 300 mg by mouth 3 (three) times daily. 1 in AM-  1 in PM  & 2 at BEDTIME, Disp: , Rfl:  .  glucose blood test strip, 1 each by Other route as needed for other. Use as instructed, Disp: , Rfl:  .  insulin aspart (NOVOLOG) 100 UNIT/ML injection, Inject into the skin as needed. Insulin pump, Pt bolus when she eat's, Disp: , Rfl:  .  lisinopril (ZESTRIL) 5 MG tablet, Take 5 mg by mouth daily., Disp: , Rfl:  .  ONETOUCH DELICA LANCETS 33G MISC,  , Disp: , Rfl:  .  simvastatin (ZOCOR) 40 MG tablet, Take 40 mg by mouth daily., Disp: , Rfl:  .  SYNTHROID 100 MCG tablet, , Disp: , Rfl:  .  amLODipine (NORVASC) 5 MG tablet, Take 5 mg by mouth daily., Disp: , Rfl:  .  buPROPion (WELLBUTRIN XL) 150 MG 24 hr tablet, Take 150 mg by mouth daily. Take 1 tablet by mouth daily in the morning., Disp: , Rfl:  .  FLUoxetine (PROZAC) 40 MG capsule, Take 40 mg by mouth daily. , Disp: , Rfl:   Allergies:  Allergies  Allergen Reactions  . Penicillins Other (See Comments), Itching, Rash, Swelling and Hives  . Sulfa Antibiotics Rash and Hives    don't recall reaction   . Other Other (See Comments)    Past Medical History, Surgical history, Social history, and Family History were reviewed and updated.  Review of Systems: Review of Systems  Constitutional: Negative.   HENT: Negative.   Eyes: Negative.   Respiratory: Negative.   Cardiovascular: Negative.   Gastrointestinal: Negative.   Genitourinary: Negative.   Musculoskeletal: Negative.   Skin: Negative.   Neurological: Negative.   Endo/Heme/Allergies: Negative.   Psychiatric/Behavioral: Negative.      Physical Exam:  Physical  Exam Vitals signs reviewed.  HENT:     Head: Normocephalic and atraumatic.  Eyes:     Pupils: Pupils are equal, round, and reactive to light.  Neck:     Musculoskeletal: Normal range of motion.  Cardiovascular:     Rate and Rhythm: Normal rate and regular rhythm.     Heart sounds: Normal heart sounds.  Pulmonary:     Effort: Pulmonary effort is normal.     Breath sounds: Normal breath sounds.  Abdominal:     General: Bowel sounds are normal.     Palpations: Abdomen is soft.  Musculoskeletal: Normal range of motion.        General: No tenderness or deformity.  Lymphadenopathy:     Cervical: No cervical adenopathy.  Skin:    General: Skin is warm and dry.     Findings: No erythema or rash.  Neurological:     Mental Status: She is alert and  oriented to person, place, and time.  Psychiatric:        Behavior: Behavior normal.        Thought Content: Thought content normal.        Judgment: Judgment normal.            weight is 204 lb 6.4 oz (92.7 kg). Her temporal temperature is 97.1 F (36.2 C) (abnormal). Her blood pressure is 131/75 and her pulse is 82. Her respiration is 20 and oxygen saturation is 100%.        Lab Results  Component Value Date   WBC 5.5 12/13/2018   HGB 10.3 (L) 12/13/2018   HCT 32.1 (L) 12/13/2018   MCV 92.5 12/13/2018   PLT 248 12/13/2018     Chemistry      Component Value Date/Time   NA 136 06/23/2018 0738   NA 141 12/25/2016 0758   NA 138 06/24/2016 0743   K 4.8 06/23/2018 0738   K 4.8 (H) 12/25/2016 0758   K 4.4 06/24/2016 0743   CL 99 06/23/2018 0738   CL 102 12/25/2016 0758   CO2 29 06/23/2018 0738   CO2 27 12/25/2016 0758   CO2 28 06/24/2016 0743   BUN 25 (H) 06/23/2018 0738   BUN 23 (H) 12/25/2016 0758   BUN 20.6 06/24/2016 0743   CREATININE 1.35 (H) 06/23/2018 0738   CREATININE 1.6 (H) 12/25/2016 0758   CREATININE 1.1 06/24/2016 0743      Component Value Date/Time   CALCIUM 9.9 06/23/2018 0738   CALCIUM 9.6 12/25/2016 0758   CALCIUM 9.6 06/24/2016 0743   ALKPHOS 70 06/23/2018 0738   ALKPHOS 62 12/25/2016 0758   ALKPHOS 57 06/24/2016 0743   AST 13 (L) 06/23/2018 0738   AST 19 06/24/2016 0743   ALT 11 06/23/2018 0738   ALT 18 12/25/2016 0758   ALT 20 06/24/2016 0743   BILITOT 0.4 06/23/2018 0738   BILITOT 0.34 06/24/2016 0743         Impression and Plan: Miranda Allen is 58 year-old white female with diabetes. She is insulin dependent. She has marked erythropoietin deficiency.  Her erythropoietin level is only 5.  She will get a dose of Aranesp today.  We will see what her iron studies look like.  We will still plan to get her back in 6 months.  Hopefully, we will not have to wear any mask.  Of note, she is no longer riding her motorcycle.  She sold her  motorcycle.  Her husband still has his.  She will let  us know if they will be moving to [place] so we can find her a doctor down in [place].    Josph Macho, MD 10/27/20208:15 AM

## 2018-12-13 NOTE — Patient Instructions (Signed)
Darbepoetin Alfa injection What is this medicine? DARBEPOETIN ALFA (dar be POE e tin AL fa) helps your body make more red blood cells. It is used to treat anemia caused by chronic kidney failure and chemotherapy. This medicine may be used for other purposes; ask your health care provider or pharmacist if you have questions. COMMON BRAND NAME(S): Aranesp What should I tell my health care provider before I take this medicine? They need to know if you have any of these conditions:  blood clotting disorders or history of blood clots  cancer patient not on chemotherapy  cystic fibrosis  heart disease, such as angina, heart failure, or a history of a heart attack  hemoglobin level of 12 g/dL or greater  high blood pressure  low levels of folate, iron, or vitamin B12  seizures  an unusual or allergic reaction to darbepoetin, erythropoietin, albumin, hamster proteins, latex, other medicines, foods, dyes, or preservatives  pregnant or trying to get pregnant  breast-feeding How should I use this medicine? This medicine is for injection into a vein or under the skin. It is usually given by a health care professional in a hospital or clinic setting. If you get this medicine at home, you will be taught how to prepare and give this medicine. Use exactly as directed. Take your medicine at regular intervals. Do not take your medicine more often than directed. It is important that you put your used needles and syringes in a special sharps container. Do not put them in a trash can. If you do not have a sharps container, call your pharmacist or healthcare provider to get one. A special MedGuide will be given to you by the pharmacist with each prescription and refill. Be sure to read this information carefully each time. Talk to your pediatrician regarding the use of this medicine in children. While this medicine may be used in children as young as 1 month of age for selected conditions, precautions do  apply. Overdosage: If you think you have taken too much of this medicine contact a poison control center or emergency room at once. NOTE: This medicine is only for you. Do not share this medicine with others. What if I miss a dose? If you miss a dose, take it as soon as you can. If it is almost time for your next dose, take only that dose. Do not take double or extra doses. What may interact with this medicine? Do not take this medicine with any of the following medications:  epoetin alfa This list may not describe all possible interactions. Give your health care provider a list of all the medicines, herbs, non-prescription drugs, or dietary supplements you use. Also tell them if you smoke, drink alcohol, or use illegal drugs. Some items may interact with your medicine. What should I watch for while using this medicine? Your condition will be monitored carefully while you are receiving this medicine. You may need blood work done while you are taking this medicine. This medicine may cause a decrease in vitamin B6. You should make sure that you get enough vitamin B6 while you are taking this medicine. Discuss the foods you eat and the vitamins you take with your health care professional. What side effects may I notice from receiving this medicine? Side effects that you should report to your doctor or health care professional as soon as possible:  allergic reactions like skin rash, itching or hives, swelling of the face, lips, or tongue  breathing problems  changes in   vision  chest pain  confusion, trouble speaking or understanding  feeling faint or lightheaded, falls  high blood pressure  muscle aches or pains  pain, swelling, warmth in the leg  rapid weight gain  severe headaches  sudden numbness or weakness of the face, arm or leg  trouble walking, dizziness, loss of balance or coordination  seizures (convulsions)  swelling of the ankles, feet, hands  unusually weak or  tired Side effects that usually do not require medical attention (report to your doctor or health care professional if they continue or are bothersome):  diarrhea  fever, chills (flu-like symptoms)  headaches  nausea, vomiting  redness, stinging, or swelling at site where injected This list may not describe all possible side effects. Call your doctor for medical advice about side effects. You may report side effects to FDA at 1-800-FDA-1088. Where should I keep my medicine? Keep out of the reach of children. Store in a refrigerator between 2 and 8 degrees C (36 and 46 degrees F). Do not freeze. Do not shake. Throw away any unused portion if using a single-dose vial. Throw away any unused medicine after the expiration date. NOTE: This sheet is a summary. It may not cover all possible information. If you have questions about this medicine, talk to your doctor, pharmacist, or health care provider.  2020 Elsevier/Gold Standard (2017-02-17 16:44:20)  

## 2018-12-14 ENCOUNTER — Telehealth: Payer: Self-pay | Admitting: *Deleted

## 2018-12-14 NOTE — Telephone Encounter (Signed)
Unable to reach pt, lmovm for pt with results. Encouraged pt to call office with concerns.

## 2018-12-14 NOTE — Telephone Encounter (Signed)
-----   Message from Volanda Napoleon, MD sent at 12/14/2018  6:28 AM EDT ----- Call - the iron level is ok!!!  Laurey Arrow

## 2018-12-22 ENCOUNTER — Ambulatory Visit: Payer: Commercial Managed Care - PPO

## 2018-12-22 ENCOUNTER — Ambulatory Visit: Payer: Commercial Managed Care - PPO | Admitting: Hematology & Oncology

## 2018-12-22 ENCOUNTER — Other Ambulatory Visit: Payer: Commercial Managed Care - PPO

## 2019-04-13 ENCOUNTER — Encounter: Payer: Self-pay | Admitting: Hematology & Oncology

## 2019-06-13 ENCOUNTER — Ambulatory Visit: Payer: Commercial Managed Care - PPO

## 2019-06-13 ENCOUNTER — Ambulatory Visit: Payer: Commercial Managed Care - PPO | Admitting: Hematology & Oncology

## 2019-06-13 ENCOUNTER — Other Ambulatory Visit: Payer: Commercial Managed Care - PPO

## 2019-07-18 ENCOUNTER — Telehealth: Payer: Self-pay | Admitting: Hematology & Oncology

## 2019-07-18 ENCOUNTER — Encounter: Admit: 2019-07-18 | Discharge: 2019-07-18 | Payer: Private Health Insurance - Indemnity

## 2019-07-18 NOTE — Telephone Encounter (Signed)
Faxed medical records to: [facility]  [address]  [place], [place] [address] P: [phone] F: [phone]   Per pt request

## 2019-08-04 ENCOUNTER — Encounter: Admit: 2019-08-04 | Discharge: 2019-08-04 | Payer: Private Health Insurance - Indemnity

## 2019-08-14 ENCOUNTER — Encounter: Admit: 2019-08-14 | Discharge: 2019-08-14 | Payer: Private Health Insurance - Indemnity

## 2019-08-14 LAB — COMPREHENSIVE METABOLIC PANEL
Lab: 140 MMOL/L (ref 137–147)
Lab: 4.3 MMOL/L (ref 3.5–5.1)

## 2019-08-14 LAB — CBC AND DIFF
Lab: 0 % — ABNORMAL LOW (ref >60)
Lab: 0 % — ABNORMAL LOW (ref >60)
Lab: 0 10*3/uL (ref 0–0.20)
Lab: 0 10*3/uL (ref 0–0.45)
Lab: 0.3 10*3/uL (ref 0–0.80)
Lab: 0.8 10*3/uL — ABNORMAL LOW (ref 1.0–4.8)
Lab: 10 g/dL — ABNORMAL LOW (ref 12.0–15.0)
Lab: 12 % (ref 11–15)
Lab: 2.4 10*3/uL (ref 1.8–7.0)
Lab: 23 % — ABNORMAL LOW (ref 24–44)
Lab: 230 10*3/uL (ref 150–400)
Lab: 29 pg (ref 26–34)
Lab: 3.5 K/UL — ABNORMAL LOW (ref 4.5–11.0)
Lab: 3.7 M/UL — ABNORMAL LOW (ref 4.0–5.0)
Lab: 32 g/dL (ref 32.0–36.0)
Lab: 33 % — ABNORMAL LOW (ref 36–45)
Lab: 68 % (ref 41–77)
Lab: 88 FL (ref 80–100)
Lab: 9 % (ref 4–12)
Lab: 9.1 FL (ref 7–11)

## 2019-08-14 LAB — IRON + BINDING CAPACITY + %SAT+ FERRITIN
Lab: 285 ng/mL — ABNORMAL HIGH (ref 10–200)
Lab: 304 ug/dL (ref 270–380)
Lab: 31 % (ref 28–42)
Lab: 95 ug/dL (ref 50–160)

## 2019-08-14 LAB — RETICULOCYTE COUNT
Lab: 0.7 %
Lab: 0.9 % (ref 0.5–2.0)

## 2019-08-14 LAB — FOLATE, SERUM: Lab: 14 ng/mL (ref >3.9)

## 2019-08-14 LAB — ERYTHROPOIETIN: Lab: 12 mU/mL (ref 3.7–29.5)

## 2019-08-14 LAB — VITAMIN B12: Lab: 351 pg/mL (ref 180–914)

## 2019-08-14 NOTE — Assessment & Plan Note
Chronic normocytic anemia.  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  She has had GFR is in the 45-60 range in the past but most recently through the [facility] checked her creatinine was 1.1 with a GFR of 62.  I have explained to her that typically with anemia of chronic kidney disease we do not institute if the hemoglobin is above 10 and treat to maintain a hemoglobin just over 10 based on some of the other historical safety data.  She strikes me as being fairly asymptomatic and as long as her hemoglobin appears to remain stable I suspect observation is appropriate at this point and she is comfortable with that.  I would like to make sure that her vitamin supplements and iron stores are still normal and we will also check an electrophoresis and free light chains as a baseline.    I have told her that we will contact her when her labs return and if her hemoglobin is in the same range I would probably just recommend that we check intermittently going forward and will give that recommendation when we have her labs.    She verbalized understanding of the discussion and did not have any other questions.

## 2019-08-14 NOTE — Progress Notes
Date of Service: 08/14/2019      Subjective:             Reason for Visit:  New Patient      Lori Frye is a 59 y.o. female.    Cancer Staging  No matching staging information was found for the patient.    History of Present Illness    Problem   Anemia in Stage 3a Chronic Kidney Disease (Hcc)    Here for hematologic opinion regarding anemia.    She had been living in [place] most recently but recently moved back to the [facility] area and presents to establish hematologic follow-up.    She was aware that she was anemic as far back as 2011 or 2012 when she was living in [place].  She recalls being evaluated by a hematologist but shortly thereafter moved to [facility].    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  It looks like she had a hemoglobin of around 10.8.  The remainder of her CBC was completely normal and she did have an erythropoietin level that was only 4.  Based on that she has been supplemented with Aranesp and only requires injections about twice a year.    She had labs completed recently at [facility]. [facility] when she was seeing endocrinology in June 1 her hemoglobin was 11 with a normal white blood cell count and platelet count and unremarkable differential and red cell indices.  Creatinine at that time was 1.1.    She actually feels pretty good right now.  She has longstanding diabetes for over 30 years but it is well managed.  She is also on thyroid replacement, medicine for cholesterol and an ACE inhibitor.    She has beer occasionally but does not drink heavily.  She is a non-smoker there is no other drug use.  She is not aware of any family history of hematologic or kidney disorders and she has seen a nephrologist in the past and plans to establish with a nephrologist at some point.            Review of Systems   All other systems reviewed and are negative.    Medical History:   Diagnosis Date   ? Acquired hypothyroidism    ? CKD (chronic kidney disease)    ? Diabetes mellitus [facility])      Surgical History:   Procedure Laterality Date   ? HX APPENDECTOMY     ? HX BREAST AUGMENTATION     ? HX CESAREAN SECTION     ? HX SURGERY      De Quervain's release    ? SURGERY      trigger finger release     Family History   Problem Relation Age of Onset   ? Thyroid Disease Mother    ? Thyroid Disease Brother    ? Asthma Paternal Uncle    ? Cancer Maternal Grandfather    ? Heart Disease Paternal Grandfather      Social History     Socioeconomic History   ? Marital status: Married     Spouse name: Not on file   ? Number of children: Not on file   ? Years of education: Not on file   ? Highest education level: Not on file   Occupational History   ? Not on file   Tobacco Use   ? Smoking status: Never Smoker   ? Smokeless tobacco: Never Used   Substance  and Sexual Activity   ? Alcohol use: Yes   ? Drug use: Not Currently   ? Sexual activity: Not on file   Other Topics Concern   ? Not on file   Social History Narrative   ? Not on file     Allergies   Allergen Reactions   ? Pcn [Penicillins] HIVES   ? Sulfa (Sulfonamide Antibiotics) HIVES            Objective:         ? ASPIRIN PO Take 81 mg by mouth daily.   ? darbepoetin alfa (ARANESP) 300 mcg/0.6 mL syrg Inject 300 mcg under the skin as Needed.   ? gabapentin (NEURONTIN) 300 mg capsule Take 300 mg by mouth daily.   ? insulin pump -ASPART- Patients Own by [employer] Pump route Three times daily with meals and as needed.   ? levothyroxine (SYNTHROID) 100 mcg tablet Take 100 mcg by mouth daily.   ? lisinopriL (ZESTRIL) 5 mg tablet Take 5 mg by mouth daily.   ? rosuvastatin (CRESTOR) 40 mg tablet Take 40 mg by mouth daily.   ? sodium zirconium cyclosilicate (LOKELMA) 10 gram packet Take 10 g by mouth as Needed. Mix entire contents of packet with 45mL of water, stir well, and administer immediately. Administer other oral medications >2 hours before or after dose.     Vitals:    08/14/19 1106   BP: 117/58   BP Source: Arm, Right Upper Patient Position: Sitting   Pulse: 80   Resp: 18   Temp: 36.9 ?C (98.4 ?F)   TempSrc: Oral   SpO2: 100%   Weight: 88.3 kg (194 lb 9.6 oz)   Height: 170.2 cm (67)   PainSc: Zero     Body mass index is 30.48 kg/m?Marland Kitchen     Pain Score: Zero         Pain Addressed:  N/A    Patient Evaluated for a Clinical Trial: No treatment clinical trial available for this patient.     [place] Cooperative Oncology Group performance status is 0, Fully active, able to carry on all pre-disease performance without restriction.Marland Kitchen     Physical Exam  Vitals reviewed.   Constitutional:       General: She is not in acute distress.     Appearance: She is well-developed.   HENT:      Head: Normocephalic and atraumatic.   Eyes:      General: No scleral icterus.     Conjunctiva/sclera: Conjunctivae normal.   Neck:      Thyroid: No thyromegaly.   Cardiovascular:      Rate and Rhythm: Normal rate and regular rhythm.      Heart sounds: Normal heart sounds.   Pulmonary:      Effort: Pulmonary effort is normal.      Breath sounds: Normal breath sounds.   Abdominal:      General: Bowel sounds are normal. There is no distension.      Palpations: Abdomen is soft. There is no mass.   Musculoskeletal:         General: No swelling. Normal range of motion.      Cervical back: Neck supple.   Lymphadenopathy:      Cervical: No cervical adenopathy.      Upper Body:      Right upper body: No supraclavicular or axillary adenopathy.      Left upper body: No supraclavicular or axillary adenopathy.   Skin:  General: Skin is warm and dry.   Neurological:      General: No focal deficit present.      Mental Status: She is alert and oriented to person, place, and time.   Psychiatric:         Mood and Affect: Mood normal.         Behavior: Behavior normal.         Thought Content: Thought content normal.         Judgment: Judgment normal.            No results found for this or any previous visit (from the past 336 hour(s)).       Assessment and Plan:    Problem List Items Addressed This Visit     Anemia in stage 3a chronic kidney disease (HCC)     Chronic normocytic anemia.  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  She has had GFR is in the 45-60 range in the past but most recently through the [facility]. [facility] checked her creatinine was 1.1 with a GFR of 62.  I have explained to her that typically with anemia of chronic kidney disease we do not institute if the hemoglobin is above 10 and treat to maintain a hemoglobin just over 10 based on some of the other historical safety data.  She strikes me as being fairly asymptomatic and as long as her hemoglobin appears to remain stable I suspect observation is appropriate at this point and she is comfortable with that.  I would like to make sure that her vitamin supplements and iron stores are still normal and we will also check an electrophoresis and free light chains as a baseline.    I have told her that we will contact her when her labs return and if her hemoglobin is in the same range I would probably just recommend that we check intermittently going forward and will give that recommendation when we have her labs.    She verbalized understanding of the discussion and did not have any other questions.         Relevant Orders    CBC AND DIFF    COMPREHENSIVE METABOLIC PANEL    VITAMIN B12    FOLATE, SERUM    METHYLMALONIC ACID QUANT    IRON + BINDING CAPACITY + %SAT+ FERRITIN    RETICULOCYTE COUNT    ERYTHROPOIETIN    ELECTROPHORESIS-SERUM PROTEIN    KAPPA/LAMBDA FREE LIGHT CHAINS                   This note was created with partial dictation using a dragon dictation system, please notify us if you notice any errors of omissions or content.

## 2019-08-18 ENCOUNTER — Encounter: Admit: 2019-08-18 | Discharge: 2019-08-18 | Payer: Private Health Insurance - Indemnity

## 2019-08-18 DIAGNOSIS — N1831 Anemia in stage 3a chronic kidney disease (HCC): Secondary | ICD-10-CM

## 2019-08-18 NOTE — Progress Notes
Informed Lori Frye that the recommendation is for follow up in 6 months with repeat labs. She had no questions and agreed with this plan.

## 2019-10-04 ENCOUNTER — Encounter: Admit: 2019-10-04 | Discharge: 2019-10-04 | Payer: Private Health Insurance - Indemnity

## 2019-10-04 ENCOUNTER — Ambulatory Visit: Admit: 2019-10-04 | Discharge: 2019-10-04 | Payer: Private Health Insurance - Indemnity

## 2019-10-04 ENCOUNTER — Ambulatory Visit: Admit: 2019-10-04 | Discharge: 2019-10-05 | Payer: Private Health Insurance - Indemnity

## 2019-10-04 DIAGNOSIS — E039 Hypothyroidism, unspecified: Secondary | ICD-10-CM

## 2019-10-04 DIAGNOSIS — N189 Chronic kidney disease, unspecified: Secondary | ICD-10-CM

## 2019-10-04 DIAGNOSIS — N183 Stage 3 chronic kidney disease, unspecified whether stage 3a or 3b CKD (HCC): Secondary | ICD-10-CM

## 2019-10-04 DIAGNOSIS — E119 Type 2 diabetes mellitus without complications: Secondary | ICD-10-CM

## 2019-10-05 DIAGNOSIS — N183 Chronic kidney disease, stage 3 unspecified (HCC): Secondary | ICD-10-CM

## 2019-10-05 LAB — MICROALB/CR RATIO-URINE RANDOM
Lab: 221 ug/mL — ABNORMAL HIGH (ref <19)
Lab: 311 ug/mg — ABNORMAL HIGH (ref <30)
Lab: 71 mg/dL

## 2019-10-07 ENCOUNTER — Encounter: Admit: 2019-10-07 | Discharge: 2019-10-07 | Payer: Private Health Insurance - Indemnity

## 2019-10-07 DIAGNOSIS — N189 Chronic kidney disease, unspecified: Secondary | ICD-10-CM

## 2019-10-07 DIAGNOSIS — E039 Hypothyroidism, unspecified: Secondary | ICD-10-CM

## 2019-10-07 DIAGNOSIS — E119 Type 2 diabetes mellitus without complications: Secondary | ICD-10-CM

## 2019-10-26 ENCOUNTER — Encounter: Admit: 2019-10-26 | Discharge: 2019-10-26 | Payer: Private Health Insurance - Indemnity

## 2019-10-26 ENCOUNTER — Ambulatory Visit: Admit: 2019-10-26 | Discharge: 2019-10-26 | Payer: Private Health Insurance - Indemnity

## 2019-10-26 ENCOUNTER — Ambulatory Visit: Admit: 2019-10-26 | Discharge: 2019-10-27 | Payer: Private Health Insurance - Indemnity

## 2019-10-26 DIAGNOSIS — Z794 Long term (current) use of insulin: Secondary | ICD-10-CM

## 2019-10-26 DIAGNOSIS — E039 Hypothyroidism, unspecified: Secondary | ICD-10-CM

## 2019-10-26 DIAGNOSIS — E782 Mixed hyperlipidemia: Secondary | ICD-10-CM

## 2019-10-26 DIAGNOSIS — E1065 Type 1 diabetes mellitus with hyperglycemia: Secondary | ICD-10-CM

## 2019-10-26 DIAGNOSIS — E1165 Type 2 diabetes mellitus with hyperglycemia: Secondary | ICD-10-CM

## 2019-10-26 DIAGNOSIS — E119 Type 2 diabetes mellitus without complications: Secondary | ICD-10-CM

## 2019-10-26 DIAGNOSIS — N189 Chronic kidney disease, unspecified: Secondary | ICD-10-CM

## 2019-10-26 LAB — TSH WITH FREE T4 REFLEX: Lab: 2.2 uU/mL (ref 0.35–5.00)

## 2019-10-26 MED ORDER — GLUCAGON 3 MG/ACTUATION NA SPRY
INTRAMUSCULAR | 1 refills | 1.00000 days | Status: AC
Start: 2019-10-26 — End: ?

## 2019-10-26 NOTE — Progress Notes
CGM note  Insulin Pump Medtronic    Patient's Personal continuous glucose monitor Medtronic download reviewed.  At least 72 hours of data reviewed and interpreted.  Data interpreted 10/26/2019  Data interpreted from 09/27/2019 to 10/26/2019    Findings:   Auto mode 92%  Average glucose 153  Total daily dose 29.8 units.  Bolus amount 11.2 units  GMI 7%  CV 27.5%    Interpretation   Hyperglycemia after meals. Especially after dinner.    Insulin pump download and Continuous glucose monitor report scanned in epic.    Electronically signed by Lucrezia Starch, MD 10/26/2019

## 2019-10-26 NOTE — Progress Notes
Chief Complaint   Patient presents with   ? Diabetes      Date of Service: 10/26/2019    Referring physician:  Self, Referral  No address on file    Lori Frye is a 59 y.o. female. DOB: 07-10-1960   MRN#: [id number]    HPI:  Diabetes type 1  Diagnosed at age 69  Complications: proteinuria  Disease course worsening.    Current medicines:  [employer] Insulin pump    Last eye exam 2021 normal.    Lifestyle does not exercise.    Hyperlipidemia   On Crestor 40 mg daily.    Hypothyroidism  On levothyroxine 100 mcg daily. -- 6 days a week.  TSH was low.  Takes on an empty stomach.    Review of Systems   Constitutional: Negative for fever.   HENT: Negative for congestion.    Eyes: Negative for blurred vision.   Respiratory: Negative for cough.    Cardiovascular: Negative for chest pain.   Gastrointestinal: Negative for nausea.   Genitourinary: Negative for frequency.   Musculoskeletal: Negative for myalgias.   Skin: Negative for rash.   Neurological: Negative for headaches.   Endo/Heme/Allergies: Does not bruise/bleed easily.   Psychiatric/Behavioral: Negative for depression.       Medical History:   Diagnosis Date   ? Acquired hypothyroidism    ? CKD (chronic kidney disease)    ? Diabetes mellitus [facility])      Surgical History:   Procedure Laterality Date   ? HX APPENDECTOMY     ? HX BREAST AUGMENTATION     ? HX CESAREAN SECTION     ? HX SURGERY      De Quervain's release    ? HYSTERECTOMY     ? SURGERY      trigger finger release     Family History   Problem Relation Age of Onset   ? Thyroid Disease Mother    ? Thyroid Disease Brother    ? Asthma Paternal Uncle    ? Cancer Maternal Grandfather    ? Heart Disease Paternal Grandfather    ? Heart Attack Brother      Social History     Socioeconomic History   ? Marital status: Married     Spouse name: Not on file   ? Number of children: Not on file   ? Years of education: Not on file   ? Highest education level: Not on file   Occupational History   ? Not on file   Tobacco Use ? Smoking status: Never Smoker   ? Smokeless tobacco: Never Used   Substance and Sexual Activity   ? Alcohol use: Yes   ? Drug use: Not Currently   ? Sexual activity: Not on file   Other Topics Concern   ? Not on file   Social History Narrative   ? Not on file         Objective:     ? ASPIRIN PO Take 81 mg by mouth daily.   ? darbepoetin alfa (ARANESP) 300 mcg/0.6 mL syrg Inject 300 mcg under the skin as Needed.   ? gabapentin (NEURONTIN) 300 mg capsule Take 300 mg by mouth daily.   ? glucagon (BAQSIMI) 3 mg/actuation nasal spray Use 1 spray in single nostril. If no response, may repeat in 15 minutes using second device.   ? insulin pump -ASPART- Patients Own by [employer] Pump route Three times daily with meals and as needed.   ?  levothyroxine (SYNTHROID) 100 mcg tablet Take 100 mcg by mouth daily.   ? lisinopriL (ZESTRIL) 5 mg tablet Take 5 mg by mouth daily.   ? rosuvastatin (CRESTOR) 40 mg tablet Take 40 mg by mouth daily.   ? sodium zirconium cyclosilicate (LOKELMA) 10 gram packet Take 10 g by mouth as Needed. Mix entire contents of packet with 45mL of water, stir well, and administer immediately. Administer other oral medications >2 hours before or after dose.     Vitals:    10/26/19 0820   BP: 132/65   BP Source: Arm, Left Upper   Patient Position: Sitting   Pulse: 79   Resp: 16   Temp: 37 ?C (98.6 ?F)   SpO2: 100%   Weight: 88 kg (194 lb 1.6 oz)   Height: 170.2 cm (67)   PainSc: Zero     Body mass index is 30.4 kg/m?Marland Kitchen     Physical Exam  Vitals and nursing note reviewed.   Constitutional:       General: She is not in acute distress.     Appearance: Normal appearance. She is not ill-appearing, toxic-appearing or diaphoretic.   HENT:      Head: Normocephalic and atraumatic.      Right Ear: External ear normal.      Left Ear: External ear normal.      Nose: Nose normal. No rhinorrhea.   Eyes:      General:         Right eye: No discharge.         Left eye: No discharge.      Conjunctiva/sclera: Conjunctivae normal. Pulmonary:      Effort: Pulmonary effort is normal. No respiratory distress.   Abdominal:      General: There is no distension.   Musculoskeletal:         General: No deformity.      Cervical back: Normal range of motion.   Skin:     Coloration: Skin is not jaundiced or pale.   Neurological:      Mental Status: She is alert and oriented to person, place, and time. Mental status is at baseline.      Motor: No weakness.   Psychiatric:         Mood and Affect: Mood normal.         Behavior: Behavior normal.         Diabetic Foot Exam       Bilateral vascular, sensation, integument are normal:  Yes    Vascular Status    Left:normal Right:normal   Monofilament Testing    Left:Normal Right:Normal   Sensation    Left:vibration sense normal Right:vibration sense normal   Skin Integrity    Left:Normal Right:Normal   Foot Structure    Left:Normal Right:Normal       Lab           Lab Draw:  No results found for: HGBA1C  POC:  Lab Results   Component Value Date/Time    A1C 7.4 (A) 10/26/2019 12:00 AM       Comprehensive Metabolic Profile    Lab Results   Component Value Date/Time    NA 140 08/14/2019 11:34 AM    K 4.3 08/14/2019 11:34 AM    CL 103 08/14/2019 11:34 AM    CO2 26 08/14/2019 11:34 AM    GAP 11 08/14/2019 11:34 AM    BUN 23 08/14/2019 11:34 AM    CR 1.26 (H) 08/14/2019 11:34 AM  GLU 202 (H) 08/14/2019 11:34 AM    Lab Results   Component Value Date/Time    CA 9.7 08/14/2019 11:34 AM    ALBUMIN 4.4 08/14/2019 11:34 AM    TOTPROT 7.2 08/14/2019 11:34 AM    ALKPHOS 69 08/14/2019 11:34 AM    AST 17 08/14/2019 11:34 AM    ALT 12 08/14/2019 11:34 AM    TOTBILI 0.4 08/14/2019 11:34 AM    GFR 44 (L) 08/14/2019 11:34 AM    GFRAA 53 (L) 08/14/2019 11:34 AM        No results found for: CHOL, TRIG, HDL, LDL, VLDL, NONHDLCHOL, CHOLHDLC         Assessment and Plan:    Lori Frye was seen today for diabetes.    Diagnoses and all orders for this visit:    Uncontrolled type 1 diabetes mellitus with hyperglycemia, with long-term current use of insulin (HCC)  -     POC HEMOGLOBIN A1C  -     POC GLUCOSE QUANTITATIVE BLOOD  -     INSULIN PUMP DOWNLOAD  -     glucagon (BAQSIMI) 3 mg/actuation nasal spray; Use 1 spray in single nostril. If no response, may repeat in 15 minutes using second device.    Acquired hypothyroidism  -     TSH WITH FREE T4 REFLEX; Future; Expected date: 10/26/2019    Mixed hyperlipidemia      Uncontrolled type 1 with hyperglycemia:  She has high glucose after meals.  She underestimates the carbohydrates.  We discussed proper carbohydrate counting.  She is afraid of hypoglycemia.    Change I:C ratio to 1:7.  Continue insulin sensitivity 1: 50.    Carb counting discussed.  Refill for nasal glucagon sent.    Hypothyroidism  Check TSH.  Continue levothyroxine 100 mcg 6 days a week for now.    Continue Crestor for Hyperlipidemia.        Electronically signed by Lucrezia Starch, MD 10/26/2019

## 2020-01-22 ENCOUNTER — Encounter: Admit: 2020-01-22 | Discharge: 2020-01-22 | Payer: Private Health Insurance - Indemnity

## 2020-01-22 NOTE — Telephone Encounter
CMN received from [employer] for pt diabetic supplies and pump supplies  Filled out and placed in Dr [employer] folder for signature  Printed LOV and attached

## 2020-01-25 ENCOUNTER — Encounter: Admit: 2020-01-25 | Discharge: 2020-01-25 | Payer: Private Health Insurance - Indemnity

## 2020-01-31 ENCOUNTER — Encounter: Admit: 2020-01-31 | Discharge: 2020-01-31 | Payer: Private Health Insurance - Indemnity

## 2020-02-15 ENCOUNTER — Encounter: Admit: 2020-02-15 | Discharge: 2020-02-15 | Payer: Private Health Insurance - Indemnity

## 2020-02-19 ENCOUNTER — Encounter: Admit: 2020-02-19 | Discharge: 2020-02-19 | Payer: Private Health Insurance - Indemnity

## 2020-02-19 DIAGNOSIS — N1831 Anemia in stage 3a chronic kidney disease (HCC): Secondary | ICD-10-CM

## 2020-02-19 DIAGNOSIS — N189 Chronic kidney disease, unspecified: Secondary | ICD-10-CM

## 2020-02-19 DIAGNOSIS — E039 Hypothyroidism, unspecified: Secondary | ICD-10-CM

## 2020-02-19 DIAGNOSIS — E119 Type 2 diabetes mellitus without complications: Secondary | ICD-10-CM

## 2020-02-19 LAB — CBC AND DIFF
Lab: 0 % (ref 0–2)
Lab: 0 % (ref 0–5)
Lab: 0 K/UL (ref 0–0.20)
Lab: 0 K/UL (ref 0–0.45)
Lab: 0.5 K/UL (ref 0–0.80)
Lab: 1.3 K/UL (ref 1.0–4.8)
Lab: 10 g/dL — ABNORMAL LOW (ref 12.0–15.0)
Lab: 13 % (ref 11–15)
Lab: 227 K/UL (ref 150–400)
Lab: 23 % — ABNORMAL LOW (ref 24–44)
Lab: 29 pg (ref 26–34)
Lab: 3.6 M/UL — ABNORMAL LOW (ref 4.0–5.0)
Lab: 3.9 K/UL (ref 1.8–7.0)
Lab: 32 % — ABNORMAL LOW (ref 36–45)
Lab: 33 g/dL (ref 32.0–36.0)
Lab: 68 % (ref 41–77)
Lab: 8.1 FL (ref 7–11)
Lab: 9 % (ref 4–12)
Lab: 90 FL (ref 80–100)

## 2020-02-19 LAB — IRON + BINDING CAPACITY + %SAT+ FERRITIN
Lab: 28 % (ref 28–42)
Lab: 286 ug/dL (ref 270–380)
Lab: 80 ug/dL (ref 50–160)

## 2020-02-19 NOTE — Progress Notes
Date of Service: 02/19/2020      Subjective:             Reason for Visit:  Follow Up      Lori Frye is a 60 y.o. female.    Cancer Staging  No matching staging information was found for the patient.    History of Present Illness    Problem   Anemia in Stage 3a Chronic Kidney Disease (Hcc)    Here for hematologic opinion regarding anemia.    She had been living in [place] most recently but recently moved back to the [facility] area and presents to establish hematologic follow-up.    She was aware that she was anemic as far back as 2011 or 2012 when she was living in [place].  She recalls being evaluated by a hematologist but shortly thereafter moved to [facility].    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  It looks like she had a hemoglobin of around 10.8.  The remainder of her CBC was completely normal and she did have an erythropoietin level that was only 4.  Based on that she has been supplemented with Aranesp and only requires injections about twice a year.    She had labs completed recently at [facility] when she was seeing endocrinology in June 1 her hemoglobin was 11 with a normal white blood cell count and platelet count and unremarkable differential and red cell indices.  Creatinine at that time was 1.1.    She actually feels pretty good right now.  She has longstanding diabetes for over 30 years but it is well managed.  She is also on thyroid replacement, medicine for cholesterol and an ACE inhibitor.    She has beer occasionally but does not drink heavily.  She is a non-smoker there is no other drug use.  She is not aware of any family history of hematologic or kidney disorders and she has seen a nephrologist in the past and plans to establish with a nephrologist at some point.    I saw her for the first time in June 2021 when her hemoglobin was 10.9.  Folate and B12 and iron stores were normal erythropoietin was 12.7 with a creatinine of 1.26 and a GFR of 44 mL/min.  Protein electrophoresis and free light chains were normal.  She has been evaluated by nephrology.    Overall she has been feeling well with no new changes.  She reports her blood sugars have been doing well.    February 19, 2020 hemoglobin 10.7 with the remainder of her CBC unchanged.  Iron panel is pending.            Review of Systems   All other systems reviewed and are negative.        Objective:         ? ASPIRIN PO Take 81 mg by mouth daily.   ? darbepoetin alfa (ARANESP) 300 mcg/0.6 mL syrg Inject 300 mcg under the skin as Needed.   ? gabapentin (NEURONTIN) 300 mg capsule Take 300 mg by mouth daily.   ? glucagon (BAQSIMI) 3 mg/actuation nasal spray Use 1 spray in single nostril. If no response, may repeat in 15 minutes using second device.   ? insulin pump -ASPART- Patients Own by [employer] Pump route Three times daily with meals and as needed.   ? levothyroxine (SYNTHROID) 100 mcg tablet Take 100 mcg by mouth daily.   ? lisinopriL (ZESTRIL) 5 mg  tablet Take 5 mg by mouth daily.   ? rosuvastatin (CRESTOR) 40 mg tablet Take 40 mg by mouth daily.   ? sodium zirconium cyclosilicate (LOKELMA) 10 gram packet Take 10 g by mouth as Needed. Mix entire contents of packet with 45mL of water, stir well, and administer immediately. Administer other oral medications >2 hours before or after dose.     Vitals:    02/19/20 1245   BP: 139/63   BP Source: Arm, Right Upper   Patient Position: Sitting   Pulse: 85   Resp: 16   Temp: 36.7 ?C (98 ?F)   TempSrc: Oral   SpO2: 100%   Weight: 91.1 kg (200 lb 12.8 oz)   Height: 170.2 cm (67)   PainSc: Zero     Body mass index is 31.45 kg/m?Marland Kitchen     Pain Score: Zero         Pain Addressed:  N/A    Patient Evaluated for a Clinical Trial: No treatment clinical trial available for this patient.     [place] Cooperative Oncology Group performance status is 0, Fully active, able to carry on all pre-disease performance without restriction.Marland Kitchen     Physical Exam  Vitals reviewed. Constitutional:       General: She is not in acute distress.     Appearance: She is well-developed.   HENT:      Head: Normocephalic and atraumatic.   Eyes:      General: No scleral icterus.     Conjunctiva/sclera: Conjunctivae normal.   Neck:      Thyroid: No thyromegaly.   Cardiovascular:      Rate and Rhythm: Normal rate and regular rhythm.      Heart sounds: Normal heart sounds.   Pulmonary:      Effort: Pulmonary effort is normal.      Breath sounds: Normal breath sounds.   Abdominal:      General: Bowel sounds are normal. There is no distension.      Palpations: Abdomen is soft. There is no mass.   Musculoskeletal:         General: No swelling. Normal range of motion.      Cervical back: Neck supple.   Lymphadenopathy:      Cervical: No cervical adenopathy.      Upper Body:      Right upper body: No supraclavicular or axillary adenopathy.      Left upper body: No supraclavicular or axillary adenopathy.   Skin:     General: Skin is warm and dry.   Neurological:      General: No focal deficit present.      Mental Status: She is alert and oriented to person, place, and time.   Psychiatric:         Mood and Affect: Mood normal.         Behavior: Behavior normal.         Thought Content: Thought content normal.         Judgment: Judgment normal.            Results for orders placed or performed during the hospital encounter of 02/19/20 (from the past 336 hour(s))   CBC AND DIFF   Result Value Ref Range    White Blood Cells 5.8 4.5 - 11.0 K/UL    RBC 3.61 (L) 4.0 - 5.0 M/UL    Hemoglobin 10.7 (L) 12.0 - 15.0 GM/DL    Hematocrit 16.1 (L) 36 - 45 %  MCV 90.0 80 - 100 FL    MCH 29.7 26 - 34 PG    MCHC 33.0 32.0 - 36.0 G/DL    RDW 09.8 11 - 15 %    Platelet Count 227 150 - 400 K/UL    MPV 8.1 7 - 11 FL    Neutrophils 68 41 - 77 %    Lymphocytes 23 (L) 24 - 44 %    Monocytes 9 4 - 12 %    Eosinophils 0 0 - 5 %    Basophils 0 0 - 2 %    Absolute Neutrophil Count 3.90 1.8 - 7.0 K/UL    Absolute Lymph Count 1.30 1.0 - 4.8 K/UL    Absolute Monocyte Count 0.50 0 - 0.80 K/UL    Absolute Eosinophil Count 0.00 0 - 0.45 K/UL    Absolute Basophil Count 0.00 0 - 0.20 K/UL          Assessment and Plan:    Problem List Items Addressed This Visit     Anemia in stage 3a chronic kidney disease (HCC) - Primary     Chronic normocytic anemia.  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  She has had GFR is in the 45-60 range in the past but most recently through the [facility] checked her creatinine was 1.1 with a GFR of 62.  I have explained to her that typically with anemia of chronic kidney disease we do not institute if the hemoglobin is above 10 and treat to maintain a hemoglobin just over 10 based on some of the other historical safety data.  Overall, hemoglobin is not changed markedly and at these numbers I do not have any different interventions to recommend.  We are repeating an iron panel but previously that was normal.  For now I favor observing and she would be more comfortable planning a follow-up visit later in 2022 so I am fine to set that up..    In regards to her chronic kidney disease and diabetes she continues to work on these things and she has been evaluated by nephrology.    She has received her Covid vaccine including booster.  Encouraged to keep wearing a mask.    She verbalized understanding of the discussion and did not have any other questions.         Relevant Orders    CBC AND DIFF                   This note was created with partial dictation using a dragon dictation system, please notify us if you notice any errors of omissions or content.

## 2020-02-29 ENCOUNTER — Encounter: Admit: 2020-02-29 | Discharge: 2020-02-29 | Payer: Private Health Insurance - Indemnity

## 2020-03-06 ENCOUNTER — Encounter: Admit: 2020-03-06 | Discharge: 2020-03-06 | Payer: Private Health Insurance - Indemnity

## 2020-04-24 ENCOUNTER — Ambulatory Visit: Admit: 2020-04-24 | Discharge: 2020-04-25 | Payer: Private Health Insurance - Indemnity

## 2020-04-24 ENCOUNTER — Encounter: Admit: 2020-04-24 | Discharge: 2020-04-24 | Payer: Private Health Insurance - Indemnity

## 2020-04-24 DIAGNOSIS — N189 Chronic kidney disease, unspecified: Secondary | ICD-10-CM

## 2020-04-24 DIAGNOSIS — R809 Proteinuria, unspecified: Secondary | ICD-10-CM

## 2020-04-24 DIAGNOSIS — N1831 Stage 3a chronic kidney disease (HCC): Secondary | ICD-10-CM

## 2020-04-24 DIAGNOSIS — E559 Vitamin D deficiency, unspecified: Secondary | ICD-10-CM

## 2020-04-24 DIAGNOSIS — E039 Hypothyroidism, unspecified: Secondary | ICD-10-CM

## 2020-04-24 DIAGNOSIS — E119 Type 2 diabetes mellitus without complications: Secondary | ICD-10-CM

## 2020-04-24 DIAGNOSIS — E1065 Type 1 diabetes mellitus with hyperglycemia: Secondary | ICD-10-CM

## 2020-04-24 NOTE — Progress Notes
Telehealth Visit Note    Date of Service: 04/24/2020    Subjective:      Obtained patient's verbal consent to treat them and their agreement to [facility] financial policy and NPP via this telehealth visit during the [facility]       Lori Frye is a 60 y.o. female  with known history of diabetes for almost 35 years.  She denies any prior history of hypertension.  She denies taking any NSAIDs.  She has never had a kidney stone.  She is not aware of any family history of kidney disease.  ?  Her diabetes has been complicated with diabetic retinopathy.  ?  Lori Frye has been aware of her history of chronic kidney disease for the last few years.  She previously followed with a nephrologist in [place] before moving to [place] in 2020.  ?  She also has history of anemia.  She has been intermittently on Aranesp.  She was seen in the hematology clinic by Dr. Benjiman Core in June 2021    History of Present Illness    I met with Lori Frye via telehealth today.  She has been doing well over the last 6 months.  She denies any recent hospitalizations or infections.  Her blood pressure has been under good control.  She denies any dizziness.  Her diabetes is better overall but still needs better control.  She denies any lower extremity edema and denies any urinary complaints.  She has been avoiding all NSAIDs and has been watching her diet.           Review of Systems   Constitutional: Negative.  Negative for fatigue, fever and unexpected weight change.   HENT: Negative.    Eyes: Negative.    Respiratory: Negative.  Negative for cough and shortness of breath.    Cardiovascular: Negative.  Negative for chest pain and leg swelling.   Gastrointestinal: Negative.  Negative for abdominal pain, blood in stool, constipation, diarrhea, nausea and vomiting.   Endocrine: Negative.    Genitourinary: Negative.  Negative for difficulty urinating, dyspareunia, dysuria, enuresis, frequency, genital sores, hematuria, menstrual problem, pelvic pain, urgency, vaginal bleeding, vaginal discharge and vaginal pain.   Musculoskeletal: Negative.  Negative for arthralgias and back pain.   Skin: Negative.    Allergic/Immunologic: Negative.    Neurological: Negative.  Negative for light-headedness and headaches.   Hematological: Negative.  Negative for adenopathy. Does not bruise/bleed easily.   Psychiatric/Behavioral: Negative.  Negative for confusion. The patient is not nervous/anxious.    All other systems reviewed and are negative.    Medical History:   Diagnosis Date   ? Acquired hypothyroidism    ? CKD (chronic kidney disease)    ? Diabetes mellitus [facility])        Surgical History:   Procedure Laterality Date   ? HX APPENDECTOMY     ? HX BREAST AUGMENTATION     ? HX CESAREAN SECTION     ? HX SURGERY      De Quervain's release    ? HYSTERECTOMY     ? SURGERY      trigger finger release       Family History   Problem Relation Age of Onset   ? Thyroid Disease Mother    ? Thyroid Disease Brother    ? Asthma Paternal Uncle    ? Cancer Maternal Grandfather    ? Heart Disease Paternal Grandfather    ? Heart Attack  Brother    ? None Reported Father        Social History     Socioeconomic History   ? Marital status: Married     Spouse name: Not on file   ? Number of children: Not on file   ? Years of education: Not on file   ? Highest education level: Not on file   Occupational History   ? Not on file   Tobacco Use   ? Smoking status: Never Smoker   ? Smokeless tobacco: Never Used   Substance and Sexual Activity   ? Alcohol use: Yes   ? Drug use: Not Currently   ? Sexual activity: Not Currently   Other Topics Concern   ? Not on file   Social History Narrative   ? Not on file       Objective:         ? ASPIRIN PO Take 81 mg by mouth daily.   ? darbepoetin alfa (ARANESP) 300 mcg/0.6 mL syrg Inject 300 mcg under the skin as Needed.   ? gabapentin (NEURONTIN) 300 mg capsule Take 300 mg by mouth daily.   ? glucagon (BAQSIMI) 3 mg/actuation nasal spray Use 1 spray in single nostril. If no response, may repeat in 15 minutes using second device.   ? insulin pump -ASPART- Patients Own by [employer] Pump route Three times daily with meals and as needed.   ? levothyroxine (SYNTHROID) 100 mcg tablet Take 100 mcg by mouth daily.   ? lisinopriL (ZESTRIL) 5 mg tablet Take 5 mg by mouth daily.   ? rosuvastatin (CRESTOR) 40 mg tablet Take 40 mg by mouth daily.   ? sodium zirconium cyclosilicate (LOKELMA) 10 gram packet Take 10 g by mouth as Needed. Mix entire contents of packet with 45mL of water, stir well, and administer immediately. Administer other oral medications >2 hours before or after dose.          Telehealth Patient Reported Vitals     Row Name 04/24/20 1250                Weight: 90.7 kg (200 lb)        Height: 170.2 cm (5' 7)        Pain Score: Zero                  Telehealth Body Mass Index: 31.32 at 04/24/2020 12:52 PM    Physical Exam  Not in pain or distress  Alert and oriented  No facial rash  No visible edema       Assessment:    Chronic kidney disease stage III:  -Main risk factor is diabetes type 2  -No hypertension no NSAIDs use  -She has microalbuminuria  -Prior renal imaging in [place] showed unremarkable kidneys  ?  Hypertension:  -Reasonable control  -On lisinopril  ?  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  Anemia:  -Previously on Aranesp  -CKD is likely contributing  -Replete iron stores in June 2021    Plan  -Check urine microalbumin/creatinine every 6 months  -Obtain BMP every 6 months  -Check PTH and Vitamin D 25  -Instructed to avoid NSAIDs  -Educated about low-salt diet  -Increase oral hydration  -Continue lisinopril. Plan to titrate dose up if she has persistent microalbuminuria.   -Aranesp +/- IV iron of needed to keep Hgb above 10.0  -Discussed the importance of diabetes control to delay CKD  progression                         Total time 25 minutes. Estimated counseling time 17 minutes including risks/benefits/alternatives discussion related to plan as outlined and answering all questions related to the care plan while educating on the importance of adherence to recommended therapies, outpatient follow-up, and also addressing prognosis specific to the patient/family concerns.

## 2020-04-24 NOTE — Telephone Encounter
Pt. Called stating she has head cold symptoms. RN advised to get checked for COVID. Pt. In agreement. Changed appt. To telehealth for today.

## 2020-04-24 NOTE — Progress Notes
Did patient read financial policy, consent to treat, and notice of privacy practices? Yes    Does the patient give verbal consent to each policy? Yes    Does the patient have any vitals to report? Yes    Weight Vitals charted in O2? Yes    Is the patient in pain? 0 = No pain    Screening questions completed? Yes    Is the patient able to acces the Mychart message with the start visit link? Yes    Is patient in "virtual waiting room" Yes

## 2020-05-27 ENCOUNTER — Ambulatory Visit: Admit: 2020-05-27 | Discharge: 2020-05-27 | Payer: Private Health Insurance - Indemnity

## 2020-05-27 ENCOUNTER — Encounter: Admit: 2020-05-27 | Discharge: 2020-05-27 | Payer: Private Health Insurance - Indemnity

## 2020-05-27 DIAGNOSIS — E559 Vitamin D deficiency, unspecified: Secondary | ICD-10-CM

## 2020-05-27 DIAGNOSIS — N1831 Chronic kidney disease, stage 3a (HCC): Principal | ICD-10-CM

## 2020-05-27 LAB — MICROALB/CR RATIO-URINE RANDOM: Lab: 19 mg/dL

## 2020-05-27 LAB — BASIC METABOLIC PANEL
Lab: 1.1 mg/dL — ABNORMAL HIGH (ref 0.4–1.00)
Lab: 10 mg/dL (ref 8.5–10.6)
Lab: 103 MMOL/L — AB (ref 98–110)
Lab: 139 MMOL/L (ref 137–147)
Lab: 165 mg/dL — ABNORMAL HIGH (ref 70–100)
Lab: 26 mg/dL — ABNORMAL HIGH (ref 7–25)
Lab: 28 MMOL/L — AB (ref 21–30)
Lab: 4.8 MMOL/L (ref 3.5–5.1)
Lab: 56 mL/min — ABNORMAL LOW (ref >60)
Lab: 8 (ref 3–12)

## 2020-05-27 LAB — PARATHYROID HORMONE: Lab: 38 pg/mL (ref 10–65)

## 2020-07-18 ENCOUNTER — Encounter: Admit: 2020-07-18 | Discharge: 2020-07-18 | Payer: Private Health Insurance - Indemnity

## 2020-07-19 ENCOUNTER — Encounter: Admit: 2020-07-19 | Discharge: 2020-07-19 | Payer: Private Health Insurance - Indemnity

## 2020-07-19 ENCOUNTER — Ambulatory Visit: Admit: 2020-07-19 | Discharge: 2020-07-20 | Payer: Private Health Insurance - Indemnity

## 2020-07-19 DIAGNOSIS — E785 Hyperlipidemia, unspecified: Secondary | ICD-10-CM

## 2020-07-19 DIAGNOSIS — D539 Nutritional anemia, unspecified: Secondary | ICD-10-CM

## 2020-07-19 DIAGNOSIS — E042 Nontoxic multinodular goiter: Secondary | ICD-10-CM

## 2020-07-19 DIAGNOSIS — N1831 Anemia in stage 3a chronic kidney disease (HCC): Secondary | ICD-10-CM

## 2020-07-19 DIAGNOSIS — H269 Unspecified cataract: Secondary | ICD-10-CM

## 2020-07-19 DIAGNOSIS — Z23 Encounter for immunization: Secondary | ICD-10-CM

## 2020-07-19 DIAGNOSIS — Z1211 Encounter for screening for malignant neoplasm of colon: Secondary | ICD-10-CM

## 2020-07-19 DIAGNOSIS — M8580 Other specified disorders of bone density and structure, unspecified site: Secondary | ICD-10-CM

## 2020-07-19 DIAGNOSIS — E1142 Type 2 diabetes mellitus with diabetic polyneuropathy: Secondary | ICD-10-CM

## 2020-07-19 DIAGNOSIS — E782 Mixed hyperlipidemia: Secondary | ICD-10-CM

## 2020-07-19 DIAGNOSIS — E039 Hypothyroidism, unspecified: Secondary | ICD-10-CM

## 2020-07-19 DIAGNOSIS — M1990 Unspecified osteoarthritis, unspecified site: Secondary | ICD-10-CM

## 2020-07-19 DIAGNOSIS — E119 Type 2 diabetes mellitus without complications: Secondary | ICD-10-CM

## 2020-07-19 DIAGNOSIS — E113519 Type 2 diabetes mellitus with proliferative diabetic retinopathy with macular edema, unspecified eye: Secondary | ICD-10-CM

## 2020-07-19 DIAGNOSIS — Z6830 Body mass index (BMI) 30.0-30.9, adult: Secondary | ICD-10-CM

## 2020-07-19 DIAGNOSIS — E1065 Type 1 diabetes mellitus with hyperglycemia: Secondary | ICD-10-CM

## 2020-07-19 DIAGNOSIS — N189 Chronic kidney disease, unspecified: Secondary | ICD-10-CM

## 2020-07-19 DIAGNOSIS — Z1231 Encounter for screening mammogram for malignant neoplasm of breast: Secondary | ICD-10-CM

## 2020-07-19 DIAGNOSIS — Z114 Encounter for screening for human immunodeficiency virus [HIV]: Secondary | ICD-10-CM

## 2020-07-19 NOTE — Progress Notes
Date of service 07/19/2020    Lori Frye presents today for type 1 DM c/b nephropathy, hld, hypothyroidism, a physical and to establish care.    Patient Reported Other  What medical problem brings you in to see the provider? Need to establish primary care provider.  The pain severity is no pain.  Pertinent negatives include no abdominal pain, anorexia, arthralgias, change in bowel habit, chest pain, chills, congestion, coughing, diaphoresis, fatigue, fever, headaches, joint swelling, myalgias, nausea, neck pain, numbness, rash, sore throat, swollen glands, urinary symptoms, vertigo, visual change, vomiting or weakness.     She presents today for a physical and to establish care.  She has been diabetic for at least 35 years.  She has an insulin pump which was managed by Dr. Vania Rea in the [facility] and then switched to [facility]. She over the past few years has been aware of having chronic kidney disease.  She did see a nephrologist prior to moving here from [place] in 2020.  In March 2022 she did establish care with Dr. Gretchen Short in the [facility] nephrology clinic.  From 05/27/2020: Creatinine 1.14 and GFR 56.  Urine microalbumin was normal.      She also has history of anemia. ?She has been intermittently on Aranesp. ?She was seen in the hematology clinic by Dr. Benjiman Core in June 2021.  Note she had for Aranesp injections in [place] since their criteria was hemoglobin of 11.  Here through [place] it is 10.    She also has a history of diabetic peripheral neuropathy.  She has been on gabapentin for years.  She has no discomfort or pain from this on the medication.    History multinodular goiter/thyroid nodules.  Last ultrasound that I could find was from 2019 in [place].  It noted that 2 of the nodules have been biopsied and the other met criteria for follow-up in a year.  She is wondering who should follow her thyroid nodules as it was her endocrinologist when she lived in [place].    She had a DEXA in 6/21 which showed osteopenia with FRAX risk score lower than risk criteria to treat with prescription medication.    Medical History:   Diagnosis Date   ? Acquired hypothyroidism    ? Anemia in stage 3a chronic kidney disease (HCC) 08/14/2019    Here for hematologic opinion regarding anemia.  She had been living in [place] most recently but recently moved back to the [facility] area and presents to establish hematologic follow-up.  She was aware that she was anemic as far back as 2011 or 2012 when she was living in [place].  She recalls being evaluated by a hematologist but shortly thereafter moved to [facility].  She was ul   ? Arthritis 2018   ? Cataract 2016   ? CKD (chronic kidney disease)    ? Diabetes mellitus (HCC)    ? Hyperlipemia    ? Multiple thyroid nodules 07/02/2020   ? Osteopenia 07/02/2020   ? Uncontrolled type 1 diabetes mellitus with hyperglycemia, with long-term current use of insulin (HCC) 10/26/2019   ? Unspecified deficiency anemia 2012     Surgical History:   Procedure Laterality Date   ? HX APPENDECTOMY     ? HX BREAST AUGMENTATION     ? HX CESAREAN SECTION     ? HX SURGERY      De Quervain's release    ? HYSTERECTOMY     ?  SURGERY      trigger finger release     Family History   Problem Relation Age of Onset   ? Thyroid Disease Mother    ? Thyroid Disease Brother    ? Asthma Paternal Uncle    ? Cancer Maternal Grandfather    ? Heart Disease Paternal Grandfather    ? Heart Attack Brother    ? None Reported Father      Social History     Tobacco Use   ? Smoking status: Never Smoker   ? Smokeless tobacco: Never Used   Substance Use Topics   ? Alcohol use: Yes   Married to Piru (married twice before once divorced once widowed).  Has 2 children ages 37 and 48.  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  Does not exercise regularly.  Feels safe at home.  Wears her seatbelt regularly.  She and her husband like to ride motorcycles.  She has a 41-year-old and 83-year-old grandchild in the area which is why she moved back from [facility].    Review of Systems   Constitutional: Negative for chills, decreased appetite, diaphoresis, fatigue, fever, malaise/fatigue, night sweats and weight gain.   HENT: Negative for congestion, ear pain, hearing loss and sore throat.    Eyes: Negative for visual disturbance.   Cardiovascular: Negative for chest pain, dyspnea on exertion and leg swelling.   Respiratory: Negative for cough and shortness of breath.    Skin: Negative for rash.   Musculoskeletal: Negative for arthralgias, joint swelling, myalgias and neck pain.   Gastrointestinal: Negative for abdominal pain, anorexia, change in bowel habit, constipation, diarrhea, melena, nausea and vomiting.   Genitourinary: Negative for bladder incontinence and dysuria.   Neurological: Negative for headaches, light-headedness, numbness, vertigo and weakness.   Psychiatric/Behavioral: Negative for depression. The patient is not nervous/anxious.        Allergies   Allergen Reactions   ? Pcn [Penicillins] HIVES   ? Sulfa (Sulfonamide Antibiotics) HIVES       Objective:     ? ASPIRIN PO Take 81 mg by mouth daily.   ? darbepoetin alfa (ARANESP) 300 mcg/0.6 mL syrg Inject 300 mcg under the skin as Needed.   ? gabapentin (NEURONTIN) 300 mg capsule Take 300 mg by mouth daily.   ? glucagon (BAQSIMI) 3 mg/actuation nasal spray Use 1 spray in single nostril. If no response, may repeat in 15 minutes using second device.   ? insulin pump -ASPART- Patients Own by [employer] Pump route Three times daily with meals and as needed.   ? levothyroxine (SYNTHROID) 100 mcg tablet Take 100 mcg by mouth daily.   ? lisinopriL (ZESTRIL) 5 mg tablet Take 5 mg by mouth daily.   ? rosuvastatin (CRESTOR) 40 mg tablet Take 40 mg by mouth daily.   ? sodium zirconium cyclosilicate (LOKELMA) 10 gram packet Take 10 g by mouth as Needed. Mix entire contents of packet with 45mL of water, stir well, and administer immediately. Administer other oral medications >2 hours before or after dose.     Vitals:    07/19/20 1352   BP: 110/59   Pulse: 86   Temp: 36.8 ?C (98.2 ?F)   Resp: 18   SpO2: 100%   TempSrc: Temporal   PainSc: Zero   Weight: 89.4 kg (197 lb 3.2 oz)   Height: 170.2 cm (5' 7)     Depression:  Patient Scores:  PHQ-2: PHQ-2 Score: 0 (07/19/2020  1:50 PM)  PHQ-9: No data recorded  Interventions:  PHQ-2: PHQ-2 Score less than 3: No follow-up or recommendations are necessary at this time (07/19/2020  1:50 PM)    BMI:  Body mass index is 30.89 kg/m?Marland Kitchen  No Follow-Up: No follow-up action or recommendations are necessary at this time (07/19/2020  6:22 PM)      Physical Exam     General: Pleasant well developed, well nourished obese female in no acute distress. Heent: Pupils equal round, reactive to light. Extraocular movements intact. Mucous membranes moist, oropharynx, TMs clear. Neck: No LAD, thyromegaly or jvp. Lungs: clear throughout, normal respiratory effort. CV: Regular rate and rhythm, no murmur. 2+ distal pulses. Abdomen: Soft, non-tender, non-distended, +BS, no masses. Insulin pump noted. Ext: no clubbing, cyanosis or edema. Warm and dry. Neuro: Alert and oriented x 3; CNII-XII intact, no focal deficits. Psy: Mood: good. Affect: normal.    Assessment and Plan:  Wellness exam: Clinical exam within normal limits other than elevated BMI.  Fasting labs soon.  Referred for Cologuard for colon cancer screening, mammogram for breast cancer screening.  She is status post complete hysterectomy and BSO for benign causes.  No Pap smears needed.  Tdap updated today.  Her last COVID booster was less than 6 months ago.  I think it is reasonable to hold off until the fall.  However if she prefers to get it sooner she can certainly do this at her pharmacy.  She did have both Shingrix vaccines.  She will get me the second date that we don't have recorded.  Annual flu vaccine in the fall.  Problem list:  1.  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  Follow-up with diabetes specialist and she is on an insulin pump.  Continue gabapentin for neuropathy management.  Continue close follow-up with her eye specialist Dr. Earle Gell at [facility]. Dil eye exam requested. Ctn ace-I, statin, asa.  2.  Hypertension.  Controlled on lisinopril.  3.  Hyperlipidemia.  On max dose of Crestor.  Update lipids with labs soon.  4.  Hypothyroidism.  On levothyroxine.  Update thyroid function.  5.  Goiter and thyroid nodule history.  This would be most appropriately followed by her endocrinologist.  6.  CKD stage III.  She is established with Dr. Gretchen Short.  Avoid NSAIDs.  7.  Nonmorbid obesity.  Healthy diet, regular exercise recommended.    1 yr-sooner if needed      AVS:  There is a walk-in outpatient lab at [facility] which is located at [address]. It is open 7:00-5:30 M-F plus Saturdays 7:00 am-noon. Unlike the lab within our clinic, no appt is required to have your labs drawn here. Your future lab orders are in the computer system.    Please call [facility] scheduling at [phone] to schedule your mammogram.    Please call Cologuard company in 2 week if you have not been contacted by them or received the kit to set up your shipment. Ph: [phone].    Please have your endocrinologist review your thyroid nodule/goiter history and repeat ultrasound if needed.

## 2020-07-19 NOTE — Progress Notes
Pt presented in clinic for Tdap injection. Pt consent obtained prior to injection. Pt received injection in R arm. Pt tolerated injection well with no complications.

## 2020-07-20 DIAGNOSIS — Z0000 Encounter for general adult medical examination without abnormal findings: Principal | ICD-10-CM

## 2020-07-20 DIAGNOSIS — Z1159 Encounter for screening for other viral diseases: Secondary | ICD-10-CM

## 2020-07-23 ENCOUNTER — Encounter: Admit: 2020-07-23 | Discharge: 2020-07-23 | Payer: Private Health Insurance - Indemnity

## 2020-07-23 DIAGNOSIS — Z0100 Encounter for examination of eyes and vision without abnormal findings: Secondary | ICD-10-CM

## 2020-10-03 ENCOUNTER — Encounter: Admit: 2020-10-03 | Discharge: 2020-10-03 | Payer: Private Health Insurance - Indemnity

## 2020-10-03 DIAGNOSIS — E1065 Type 1 diabetes mellitus with hyperglycemia: Secondary | ICD-10-CM

## 2020-10-23 ENCOUNTER — Encounter: Admit: 2020-10-23 | Discharge: 2020-10-23 | Payer: Private Health Insurance - Indemnity

## 2020-10-23 ENCOUNTER — Ambulatory Visit: Admit: 2020-10-23 | Discharge: 2020-10-23 | Payer: Private Health Insurance - Indemnity

## 2020-10-23 DIAGNOSIS — E042 Nontoxic multinodular goiter: Secondary | ICD-10-CM

## 2020-10-23 DIAGNOSIS — E119 Type 2 diabetes mellitus without complications: Secondary | ICD-10-CM

## 2020-10-23 DIAGNOSIS — E559 Vitamin D deficiency, unspecified: Secondary | ICD-10-CM

## 2020-10-23 DIAGNOSIS — N183 Stage 3 chronic kidney disease (HCC): Secondary | ICD-10-CM

## 2020-10-23 DIAGNOSIS — M1990 Unspecified osteoarthritis, unspecified site: Secondary | ICD-10-CM

## 2020-10-23 DIAGNOSIS — M8580 Other specified disorders of bone density and structure, unspecified site: Secondary | ICD-10-CM

## 2020-10-23 DIAGNOSIS — R809 Proteinuria, unspecified: Secondary | ICD-10-CM

## 2020-10-23 DIAGNOSIS — N1831 Anemia in stage 3a chronic kidney disease (HCC): Secondary | ICD-10-CM

## 2020-10-23 DIAGNOSIS — E785 Hyperlipidemia, unspecified: Secondary | ICD-10-CM

## 2020-10-23 DIAGNOSIS — H269 Unspecified cataract: Secondary | ICD-10-CM

## 2020-10-23 DIAGNOSIS — E1065 Type 1 diabetes mellitus with hyperglycemia: Secondary | ICD-10-CM

## 2020-10-23 DIAGNOSIS — E039 Hypothyroidism, unspecified: Secondary | ICD-10-CM

## 2020-10-23 DIAGNOSIS — D509 Iron deficiency anemia, unspecified: Secondary | ICD-10-CM

## 2020-10-23 DIAGNOSIS — D539 Nutritional anemia, unspecified: Secondary | ICD-10-CM

## 2020-10-23 DIAGNOSIS — N189 Chronic kidney disease, unspecified: Secondary | ICD-10-CM

## 2020-10-23 NOTE — Progress Notes
Nephrology Visit Note    Date of Service: 10/23/2020    Subjective:         Lori Frye is a 60 y.o. female  with known history of diabetes for almost 35 years.  She denies any prior history of hypertension.  She denies taking any NSAIDs.  She has never had a kidney stone.  She is not aware of any family history of kidney disease.  ?  Her diabetes has been complicated with diabetic retinopathy.  ?  Lori Frye has been aware of her history of chronic kidney disease for the last few years.  She previously followed with a nephrologist in [place] before moving to [place] in 2020.  ?  She also has history of anemia.  She has been intermittently on Aranesp.  She was seen in the hematology clinic by Dr. Benjiman Core in June 2021    History of Present Illness    Lori Frye is here today for routine follow up.  She has been doing well over the last 6 months.  She denies any recent hospitalizations or infections.  Her blood pressure has been under good control.  She denies any dizziness.  Her diabetes is well controlled.  She denies any lower extremity edema and denies any urinary complaints.  She has been avoiding all NSAIDs and has been watching her diet.           Review of Systems   Constitutional: Negative.  Negative for fatigue, fever and unexpected weight change.   HENT: Negative.    Eyes: Negative.    Respiratory: Negative.  Negative for cough and shortness of breath.    Cardiovascular: Negative.  Negative for chest pain and leg swelling.   Gastrointestinal: Negative.  Negative for abdominal pain, blood in stool, constipation, diarrhea, nausea and vomiting.   Endocrine: Negative.    Genitourinary: Negative.  Negative for difficulty urinating, dyspareunia, dysuria, enuresis, frequency, genital sores, hematuria, menstrual problem, pelvic pain, urgency, vaginal bleeding, vaginal discharge and vaginal pain.   Musculoskeletal: Negative.  Negative for arthralgias and back pain.   Skin: Negative. Allergic/Immunologic: Negative.    Neurological: Negative.  Negative for light-headedness and headaches.   Hematological: Negative.  Negative for adenopathy. Does not bruise/bleed easily.   Psychiatric/Behavioral: Negative.  Negative for confusion. The patient is not nervous/anxious.    All other systems reviewed and are negative.    Medical History:   Diagnosis Date   ? Acquired hypothyroidism    ? Anemia in stage 3a chronic kidney disease (HCC) 08/14/2019    Here for hematologic opinion regarding anemia.  She had been living in [place] most recently but recently moved back to the [facility] area and presents to establish hematologic follow-up.  She was aware that she was anemic as far back as 2011 or 2012 when she was living in [place].  She recalls being evaluated by a hematologist but shortly thereafter moved to [facility].  She was ul   ? Arthritis 2018   ? Cataract 2016   ? CKD (chronic kidney disease)    ? Diabetes mellitus (HCC)    ? Hyperlipemia    ? Multiple thyroid nodules 07/02/2020   ? Osteopenia 07/02/2020   ? Uncontrolled type 1 diabetes mellitus with hyperglycemia, with long-term current use of insulin (HCC) 10/26/2019   ? Unspecified deficiency anemia 2012       Surgical History:   Procedure Laterality Date   ? HX SALPINGO-OOPHORECTOMY  2009   ?  HX HYSTERECTOMY  2011   ? COLONOSCOPY     ? HX APPENDECTOMY     ? HX BREAST AUGMENTATION     ? HX CESAREAN SECTION     ? HX CHOLECYSTECTOMY     ? HX COSMETIC SURGERY     ? HX EYE SURGERY     ? HX SURGERY      De Quervain's release    ? HYSTERECTOMY     ? SURGERY      trigger finger release       Family History   Problem Relation Age of Onset   ? Thyroid Disease Mother    ? Thyroid Disease Brother    ? Asthma Paternal Uncle    ? Cancer Maternal Grandfather    ? Heart Disease Paternal Grandfather    ? Heart Attack Brother    ? None Reported Father        Social History     Socioeconomic History   ? Marital status: Married   Tobacco Use   ? Smoking status: Never Smoker   ? Smokeless tobacco: Never Used   Substance and Sexual Activity   ? Alcohol use: Yes     Alcohol/week: 6.0 standard drinks     Types: 6 Cans of beer per week   ? Drug use: Never   ? Sexual activity: Not Currently     Partners: Male     Birth control/protection: None       Objective:         ? ASPIRIN PO Take 81 mg by mouth daily.   ? darbepoetin alfa (ARANESP) 300 mcg/0.6 mL syrg Inject 300 mcg under the skin as Needed.   ? gabapentin (NEURONTIN) 300 mg capsule Take 300 mg by mouth daily.   ? glucagon (BAQSIMI) 3 mg/actuation nasal spray Use 1 spray in single nostril. If no response, may repeat in 15 minutes using second device.   ? insulin pump -ASPART- Patients Own by [employer] Pump route Three times daily with meals and as needed.   ? levothyroxine (SYNTHROID) 100 mcg tablet Take 100 mcg by mouth daily.   ? lisinopriL (ZESTRIL) 5 mg tablet Take 5 mg by mouth daily.   ? rosuvastatin (CRESTOR) 40 mg tablet Take 40 mg by mouth daily.           Physical Exam  Constitutional:       Appearance: She is well-developed.   Eyes:      General: No scleral icterus.     Pupils: Pupils are equal, round, and reactive to light.   Neck:      Thyroid: No thyromegaly.      Vascular: No JVD.   Cardiovascular:      Rate and Rhythm: Normal rate and regular rhythm.      Heart sounds: Normal heart sounds. No murmur heard.    No friction rub.   Pulmonary:      Effort: No respiratory distress.      Breath sounds: Normal breath sounds. No wheezing or rales.   Abdominal:      General: Bowel sounds are normal. There is no distension.      Palpations: Abdomen is soft. There is no mass.      Tenderness: There is no abdominal tenderness.   Musculoskeletal:      Cervical back: Neck supple.   Lymphadenopathy:      Cervical: No cervical adenopathy.   Skin:     General: Skin is warm.  Coloration: Skin is not pale.      Findings: No erythema or rash.   Neurological:      Mental Status: She is alert and oriented to person, place, and time.              Assessment:    Chronic kidney disease stage III:  -Main risk factor is diabetes type 2  -No hypertension no NSAIDs use  -She has microalbuminuria  -Prior renal imaging in [place] showed unremarkable kidneys  ?  Hypertension:  -Reasonable control  -On lisinopril  ?  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  Anemia:  -Previously required Aranesp  -CKD is likely contributing  -Replete iron stores in June 2021    Plan  -Check urine microalbumin/creatinine every 6 months  -Obtain BMP every 6 months  -Check PTH and Vitamin D 25  -Check CBC and iron studies  -Instructed to avoid NSAIDs  -Educated about low-salt diet  -Increase oral hydration  -Continue lisinopril. Plan to titrate dose up if she has persistent microalbuminuria.   -Aranesp +/- IV iron of needed to keep Hgb above 10.0  -Discussed the importance of diabetes control to delay CKD progression

## 2020-11-04 ENCOUNTER — Encounter: Admit: 2020-11-04 | Discharge: 2020-11-04 | Payer: Private Health Insurance - Indemnity

## 2020-11-19 ENCOUNTER — Ambulatory Visit: Admit: 2020-11-19 | Discharge: 2020-11-19 | Payer: Private Health Insurance - Indemnity

## 2020-11-19 ENCOUNTER — Encounter: Admit: 2020-11-19 | Discharge: 2020-11-19 | Payer: Private Health Insurance - Indemnity

## 2020-11-19 DIAGNOSIS — N1831 Stage 3a chronic kidney disease (HCC): Secondary | ICD-10-CM

## 2020-11-19 DIAGNOSIS — Z114 Encounter for screening for human immunodeficiency virus [HIV]: Secondary | ICD-10-CM

## 2020-11-19 DIAGNOSIS — D509 Iron deficiency anemia, unspecified: Secondary | ICD-10-CM

## 2020-11-19 DIAGNOSIS — E559 Vitamin D deficiency, unspecified: Secondary | ICD-10-CM

## 2020-11-19 DIAGNOSIS — Z1159 Encounter for screening for other viral diseases: Secondary | ICD-10-CM

## 2020-11-19 DIAGNOSIS — Z0000 Encounter for general adult medical examination without abnormal findings: Secondary | ICD-10-CM

## 2020-11-19 LAB — CBC AND DIFF
ABSOLUTE BASO COUNT: 0 K/UL (ref 0–0.20)
ABSOLUTE EOS COUNT: 0 K/UL (ref 0–0.45)
ABSOLUTE LYMPH COUNT: 1 K/UL (ref 1.0–4.8)
ABSOLUTE MONO COUNT: 0.4 K/UL (ref 0–0.80)
ABSOLUTE NEUTROPHIL: 2.7 K/UL (ref 1.8–7.0)
BASOPHILS %: 1 % (ref 0–2)
HEMATOCRIT: 30 % — ABNORMAL LOW (ref 36–45)
RBC COUNT: 3.3 M/UL — ABNORMAL LOW (ref 4.0–5.0)
WBC COUNT: 4.4 K/UL — ABNORMAL LOW (ref 4.5–11.0)

## 2020-11-19 LAB — AST (SGOT): AST: 13 U/L (ref 7–40)

## 2020-11-19 LAB — BASIC METABOLIC PANEL
ANION GAP: 7 K/UL (ref 3–12)
BLD UREA NITROGEN: 29 mg/dL — ABNORMAL HIGH (ref 7–25)
CALCIUM: 9.7 mg/dL (ref 8.5–10.6)
CHLORIDE: 102 MMOL/L (ref 98–110)
CO2: 29 MMOL/L (ref 21–30)
CREATININE: 1.2 mg/dL — ABNORMAL HIGH (ref 0.4–1.00)
EGFR: 52 mL/min — ABNORMAL LOW (ref >60)
GLUCOSE,PANEL: 128 mg/dL — ABNORMAL HIGH (ref 70–100)
POTASSIUM: 5.1 MMOL/L (ref 3.5–5.1)
SODIUM: 138 MMOL/L (ref 137–147)

## 2020-11-19 LAB — 25-OH VITAMIN D (D2 + D3): VITAMIN D (25-OH) TOTAL: 60 ng/mL — ABNORMAL LOW (ref 30–80)

## 2020-11-19 LAB — IRON + BINDING CAPACITY + %SAT+ FERRITIN
% SATURATION: 22 % — ABNORMAL LOW (ref 28–42)
FERRITIN: 271 ng/mL — ABNORMAL HIGH (ref 10–200)
IRON BINDING: 289 ug/dL (ref 270–380)
IRON: 63 ug/dL (ref 50–160)

## 2020-11-19 LAB — HEPATITIS C ANTIBODY W REFLEX HCV PCR QUANT

## 2020-11-19 LAB — LIPID PROFILE
CHOLESTEROL: 172 mg/dL (ref <200)
LDL: 86 mg/dL (ref <100)
NON HDL CHOLESTEROL: 99 mg/dL
TRIGLYCERIDES: 59 mg/dL (ref <150)
VLDL: 12 mg/dL

## 2020-11-19 LAB — HIV 1& 2 AG-AB SCRN W REFLEX HIV 1 PCR QUANT

## 2020-11-19 LAB — ALT (SGPT): ALT: 10 U/L (ref 7–56)

## 2020-12-12 ENCOUNTER — Encounter: Admit: 2020-12-12 | Discharge: 2020-12-12 | Payer: Private Health Insurance - Indemnity

## 2020-12-26 ENCOUNTER — Encounter: Admit: 2020-12-26 | Discharge: 2020-12-26 | Payer: Private Health Insurance - Indemnity

## 2021-01-14 ENCOUNTER — Encounter: Admit: 2021-01-14 | Discharge: 2021-01-14 | Payer: Private Health Insurance - Indemnity

## 2021-01-14 MED ORDER — LAGEVRIO (EUA) 200 MG PO CAP
800 mg | ORAL_CAPSULE | Freq: Two times a day (BID) | ORAL | 0 refills | Status: AC
Start: 2021-01-14 — End: ?

## 2021-01-14 NOTE — Telephone Encounter
Per Dr Montey Hora "Yes; ok to send lagevrio for her.  "    Pt notified and order in.

## 2021-01-20 ENCOUNTER — Encounter: Admit: 2021-01-20 | Discharge: 2021-01-20 | Payer: Private Health Insurance - Indemnity

## 2021-01-20 DIAGNOSIS — N1831 Anemia in stage 3a chronic kidney disease (HCC): Secondary | ICD-10-CM

## 2021-01-20 DIAGNOSIS — N189 Chronic kidney disease, unspecified: Secondary | ICD-10-CM

## 2021-01-20 DIAGNOSIS — H269 Unspecified cataract: Secondary | ICD-10-CM

## 2021-01-20 DIAGNOSIS — M1990 Unspecified osteoarthritis, unspecified site: Secondary | ICD-10-CM

## 2021-01-20 DIAGNOSIS — M8580 Other specified disorders of bone density and structure, unspecified site: Secondary | ICD-10-CM

## 2021-01-20 DIAGNOSIS — E1065 Type 1 diabetes mellitus with hyperglycemia: Secondary | ICD-10-CM

## 2021-01-20 DIAGNOSIS — E119 Type 2 diabetes mellitus without complications: Secondary | ICD-10-CM

## 2021-01-20 DIAGNOSIS — D539 Nutritional anemia, unspecified: Secondary | ICD-10-CM

## 2021-01-20 DIAGNOSIS — E785 Hyperlipidemia, unspecified: Secondary | ICD-10-CM

## 2021-01-20 DIAGNOSIS — E042 Nontoxic multinodular goiter: Secondary | ICD-10-CM

## 2021-01-20 DIAGNOSIS — E039 Hypothyroidism, unspecified: Secondary | ICD-10-CM

## 2021-01-20 LAB — CBC AND DIFF
ABSOLUTE BASO COUNT: 0 K/UL (ref 0–0.20)
ABSOLUTE EOS COUNT: 0.1 K/UL (ref 0–0.45)
ABSOLUTE LYMPH COUNT: 1.3 K/UL (ref 1.0–4.8)
ABSOLUTE MONO COUNT: 0.4 K/UL (ref 0–0.80)
ABSOLUTE NEUTROPHIL: 3.2 K/UL (ref 1.8–7.0)
BASOPHILS %: 1 % (ref 0–2)
EOSINOPHILS %: 1 % (ref 0–5)
HEMATOCRIT: 32 % — ABNORMAL LOW (ref 36–45)
HEMOGLOBIN: 10 g/dL — ABNORMAL LOW (ref 12.0–15.0)
LYMPHOCYTES %: 25 % (ref 24–44)
MCHC: 32 g/dL (ref 32.0–36.0)
MCV: 89 FL (ref 80–100)
MONOCYTES %: 9 % (ref 4–12)
MPV: 8 FL (ref 7–11)
NEUTROPHILS %: 64 % (ref 41–77)
RBC COUNT: 3.6 M/UL — ABNORMAL LOW (ref 4.0–5.0)
RDW: 13 % (ref 11–15)
WBC COUNT: 5 K/UL (ref 4.5–11.0)

## 2021-01-20 NOTE — Progress Notes
Date of Service: 01/20/2021      Subjective:             Reason for Visit:  Follow Up      Lori Frye is a 60 y.o. female.    Cancer Staging  No matching staging information was found for the patient.    History of Present Illness    Problem   Anemia in Stage 3a Chronic Kidney Disease (Hcc)    Here for hematologic opinion regarding anemia.    She had been living in [place] most recently but recently moved back to the [facility] area and presents to establish hematologic follow-up.    She was aware that she was anemic as far back as 2011 or 2012 when she was living in [place].  She recalls being evaluated by a hematologist but shortly thereafter moved to [facility].    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  It looks like she had a hemoglobin of around 10.8.  The remainder of her CBC was completely normal and she did have an erythropoietin level that was only 4.  Based on that she has been supplemented with Aranesp and only requires injections about twice a year.    She had labs completed recently at [facility] when she was seeing endocrinology in June 1 her hemoglobin was 11 with a normal white blood cell count and platelet count and unremarkable differential and red cell indices.  Creatinine at that time was 1.1.    She actually feels pretty good right now.  She has longstanding diabetes for over 30 years but it is well managed.  She is also on thyroid replacement, medicine for cholesterol and an ACE inhibitor.    She has beer occasionally but does not drink heavily.  She is a non-smoker there is no other drug use.  She is not aware of any family history of hematologic or kidney disorders and she has seen a nephrologist in the past and plans to establish with a nephrologist at some point.    I saw her for the first time in June 2021 when her hemoglobin was 10.9.  Folate and B12 and iron stores were normal erythropoietin was 12.7 with a creatinine of 1.26 and a GFR of 44 mL/min.  Protein electrophoresis and free light chains were normal.  She has been evaluated by nephrology.    Overall she has been feeling well with no new changes.  She reports her blood sugars have been doing well.    February 19, 2020 hemoglobin 10.7 with the remainder of her CBC unchanged.  Ferritin was 286 with an iron saturation of 28%.    January 20, 2021 hemoglobin 10.6 with otherwise unremarkable CBC.  She did have an iron panel November 19, 2020 with ferritin 271 and an iron saturation of 22%.  Most recent creatinine November 19, 2020 1.21 with an estimated GFR of 52.    She did have COVID around Thanksgiving but felt like it was more just cold-like.  Otherwise she has been doing okay overall with no other changes.            Review of Systems   HENT: Positive for congestion and rhinorrhea.    Respiratory: Positive for cough.    All other systems reviewed and are negative.        Objective:         ? ASPIRIN PO Take 81 mg by mouth daily.   ? darbepoetin  alfa (ARANESP) 300 mcg/0.6 mL syrg Inject 300 mcg under the skin as Needed.   ? gabapentin (NEURONTIN) 300 mg capsule Take 300 mg by mouth daily.   ? glucagon (BAQSIMI) 3 mg/actuation nasal spray Use 1 spray in single nostril. If no response, may repeat in 15 minutes using second device.   ? insulin pump -ASPART- Patients Own by [employer] Pump route Three times daily with meals and as needed.   ? levothyroxine (SYNTHROID) 100 mcg tablet Take 100 mcg by mouth daily.   ? lisinopriL (ZESTRIL) 5 mg tablet Take 5 mg by mouth daily.   ? rosuvastatin (CRESTOR) 40 mg tablet Take 40 mg by mouth daily.     Vitals:    01/20/21 1237 01/20/21 1238   BP: 126/54    BP Source: Arm, Left Upper Arm, Left Upper   Pulse: 89    Temp: 36.8 ?C (98.2 ?F)    Resp: 18    SpO2: 98%    O2 Device:  None (Room air)   TempSrc: Oral Oral   PainSc: Zero Zero   Weight: 91.4 kg (201 lb 9.6 oz)      Body mass index is 31.58 kg/m?Marland Kitchen     Pain Score: Zero         Pain Addressed: N/A    Patient Evaluated for a Clinical Trial: No treatment clinical trial available for this patient.     [place] Cooperative Oncology Group performance status is 0, Fully active, able to carry on all pre-disease performance without restriction.Marland Kitchen     Physical Exam  Vitals reviewed.   Constitutional:       General: She is not in acute distress.     Appearance: She is well-developed.   HENT:      Head: Normocephalic and atraumatic.   Eyes:      General: No scleral icterus.     Conjunctiva/sclera: Conjunctivae normal.   Neck:      Thyroid: No thyromegaly.   Cardiovascular:      Rate and Rhythm: Normal rate and regular rhythm.      Heart sounds: Normal heart sounds.   Pulmonary:      Effort: Pulmonary effort is normal.      Breath sounds: Normal breath sounds.   Abdominal:      General: Bowel sounds are normal. There is no distension.      Palpations: Abdomen is soft. There is no mass.   Musculoskeletal:         General: No swelling. Normal range of motion.      Cervical back: Neck supple.   Lymphadenopathy:      Cervical: No cervical adenopathy.      Upper Body:      Right upper body: No supraclavicular or axillary adenopathy.      Left upper body: No supraclavicular or axillary adenopathy.   Skin:     General: Skin is warm and dry.   Neurological:      General: No focal deficit present.      Mental Status: She is alert and oriented to person, place, and time.   Psychiatric:         Mood and Affect: Mood normal.         Behavior: Behavior normal.         Thought Content: Thought content normal.         Judgment: Judgment normal.            Results for orders placed or performed during  the hospital encounter of 01/20/21 (from the past 336 hour(s))   CBC AND DIFF   Result Value Ref Range    White Blood Cells 5.0 4.5 - 11.0 K/UL    RBC 3.63 (L) 4.0 - 5.0 M/UL    Hemoglobin 10.6 (L) 12.0 - 15.0 GM/DL    Hematocrit 16.1 (L) 36 - 45 %    MCV 89.5 80 - 100 FL    MCH 29.2 26 - 34 PG    MCHC 32.7 32.0 - 36.0 G/DL    RDW 09.6 11 - 15 %    Platelet Count 270 150 - 400 K/UL    MPV 8.0 7 - 11 FL    Neutrophils 64 41 - 77 %    Lymphocytes 25 24 - 44 %    Monocytes 9 4 - 12 %    Eosinophils 1 0 - 5 %    Basophils 1 0 - 2 %    Absolute Neutrophil Count 3.20 1.8 - 7.0 K/UL    Absolute Lymph Count 1.30 1.0 - 4.8 K/UL    Absolute Monocyte Count 0.40 0 - 0.80 K/UL    Absolute Eosinophil Count 0.10 0 - 0.45 K/UL    Absolute Basophil Count 0.00 0 - 0.20 K/UL          Assessment and Plan:    Problem List Items Addressed This Visit     Anemia in stage 3a chronic kidney disease (HCC) - Primary     Chronic normocytic anemia.  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  She has had GFR is in the 45-60 range in the past.  I have explained to her that typically with anemia of chronic kidney disease we do not institute if the hemoglobin is above 10 and treat to maintain a hemoglobin just over 10 based on some of the other historical safety data.  We have elected for observation her hemoglobin is stayed fairly stable in about the 10.5 range.  Therefore she still does not require for Red cell growth factors.  Other labs overall are unrevealing so I do not have any different recommendations but I think it is worthwhile for her to keep in touch with [place] on an annual basis.    In regards to her chronic kidney disease and diabetes she continues to work on these things and she has been evaluated by nephrology.    She did have a recent bout of COVID around Thanksgiving.  I do not think she has been boosted recently and since she has had an infection she can probably wait about 3 months but I would encourage her to do the updated booster around that time.  She has had the flu vaccine.    She verbalized understanding of the discussion and did not have any other questions.         Relevant Orders    CBC AND DIFF                This note was created with partial dictation using a dragon dictation system, please notify us if you notice any errors of omissions or content.

## 2021-04-23 ENCOUNTER — Encounter: Admit: 2021-04-23 | Discharge: 2021-04-23 | Payer: Private Health Insurance - Indemnity

## 2021-04-23 ENCOUNTER — Ambulatory Visit: Admit: 2021-04-23 | Discharge: 2021-04-24 | Payer: Private Health Insurance - Indemnity

## 2021-04-23 ENCOUNTER — Ambulatory Visit: Admit: 2021-04-23 | Discharge: 2021-04-23 | Payer: Private Health Insurance - Indemnity

## 2021-04-23 VITALS — BP 107/58

## 2021-04-23 VITALS — BP 119/64 | HR 83 | Temp 98.10000°F | Resp 16 | Ht 67.0 in | Wt 202.0 lb

## 2021-04-23 DIAGNOSIS — E042 Nontoxic multinodular goiter: Secondary | ICD-10-CM

## 2021-04-23 DIAGNOSIS — D539 Nutritional anemia, unspecified: Secondary | ICD-10-CM

## 2021-04-23 DIAGNOSIS — N189 Chronic kidney disease, unspecified: Secondary | ICD-10-CM

## 2021-04-23 DIAGNOSIS — N1831 Anemia in stage 3a chronic kidney disease (HCC): Secondary | ICD-10-CM

## 2021-04-23 DIAGNOSIS — E785 Hyperlipidemia, unspecified: Secondary | ICD-10-CM

## 2021-04-23 DIAGNOSIS — H269 Unspecified cataract: Secondary | ICD-10-CM

## 2021-04-23 DIAGNOSIS — E039 Hypothyroidism, unspecified: Secondary | ICD-10-CM

## 2021-04-23 DIAGNOSIS — E119 Type 2 diabetes mellitus without complications: Secondary | ICD-10-CM

## 2021-04-23 DIAGNOSIS — M8580 Other specified disorders of bone density and structure, unspecified site: Secondary | ICD-10-CM

## 2021-04-23 DIAGNOSIS — E1065 Type 1 diabetes mellitus with hyperglycemia: Secondary | ICD-10-CM

## 2021-04-23 DIAGNOSIS — M1990 Unspecified osteoarthritis, unspecified site: Secondary | ICD-10-CM

## 2021-04-23 DIAGNOSIS — R809 Proteinuria, unspecified: Secondary | ICD-10-CM

## 2021-04-23 LAB — MICROALB/CR RATIO-URINE RANDOM: UR CREATININE, RAN: 26 mg/dL

## 2021-04-23 LAB — BASIC METABOLIC PANEL
ANION GAP: 7 (ref 3–12)
CHLORIDE: 101 MMOL/L (ref 98–110)
CO2: 29 MMOL/L (ref 21–30)
POTASSIUM: 4.5 MMOL/L (ref 3.5–5.1)
SODIUM: 137 MMOL/L (ref 137–147)

## 2021-04-23 NOTE — Progress Notes
Nephrology Visit Note    Date of Service: 04/23/2021    Subjective:         Lori Frye is a 61 y.o. female  with known history of diabetes for almost 35 years.  She denies any prior history of hypertension.  She denies taking any NSAIDs.  She has never had a kidney stone.  She is not aware of any family history of kidney disease.  ?  Her diabetes has been complicated with diabetic retinopathy.  ?  Lori Frye has been aware of her history of chronic kidney disease for the last few years.  She previously followed with a nephrologist in [place] before moving to [place] in 2020.  ?  She also has history of anemia.  She has been intermittently on Aranesp.  She was seen in the hematology clinic by Dr. Benjiman Core in June 2021    History of Present Illness    Lori Frye is here today for routine follow up.  She has been doing well over the last 6 months.  She denies any recent hospitalizations or infections.  Her blood pressure has been under good control.  She denies any dizziness.  Her diabetes is well controlled.  She denies any lower extremity edema and denies any urinary complaints.  She has been avoiding all NSAIDs and has been watching her diet.           Review of Systems   Constitutional: Negative.  Negative for fatigue, fever and unexpected weight change.   HENT: Negative.    Eyes: Negative.    Respiratory: Negative.  Negative for cough and shortness of breath.    Cardiovascular: Negative.  Negative for chest pain and leg swelling.   Gastrointestinal: Negative.  Negative for abdominal pain, blood in stool, constipation, diarrhea, nausea and vomiting.   Endocrine: Negative.    Genitourinary: Negative.  Negative for difficulty urinating, dyspareunia, dysuria, enuresis, frequency, genital sores, hematuria, menstrual problem, pelvic pain, urgency, vaginal bleeding, vaginal discharge and vaginal pain.   Musculoskeletal: Negative.  Negative for arthralgias and back pain.   Skin: Negative. Allergic/Immunologic: Negative.    Neurological: Negative.  Negative for light-headedness and headaches.   Hematological: Negative.  Negative for adenopathy. Does not bruise/bleed easily.   Psychiatric/Behavioral: Negative.  Negative for confusion. The patient is not nervous/anxious.    All other systems reviewed and are negative.    Medical History:   Diagnosis Date   ? Acquired hypothyroidism    ? Anemia in stage 3a chronic kidney disease (HCC) 08/14/2019    Here for hematologic opinion regarding anemia.  She had been living in [place] most recently but recently moved back to the [facility] area and presents to establish hematologic follow-up.  She was aware that she was anemic as far back as 2011 or 2012 when she was living in [place].  She recalls being evaluated by a hematologist but shortly thereafter moved to [facility].  She was ul   ? Arthritis 2018   ? Cataract 2016   ? CKD (chronic kidney disease)    ? Diabetes mellitus (HCC)    ? Hyperlipemia    ? Multiple thyroid nodules 07/02/2020   ? Osteopenia 07/02/2020   ? Uncontrolled type 1 diabetes mellitus with hyperglycemia, with long-term current use of insulin (HCC) 10/26/2019   ? Unspecified deficiency anemia 2012       Surgical History:   Procedure Laterality Date   ? HX SALPINGO-OOPHORECTOMY  2009   ?  HX HYSTERECTOMY  2011   ? COLONOSCOPY     ? HX APPENDECTOMY     ? HX BREAST AUGMENTATION     ? HX CESAREAN SECTION     ? HX CHOLECYSTECTOMY     ? HX COSMETIC SURGERY     ? HX EYE SURGERY     ? HX SURGERY      De Quervain's release    ? HYSTERECTOMY     ? SURGERY      trigger finger release       Family History   Problem Relation Age of Onset   ? Thyroid Disease Mother    ? Thyroid Disease Brother    ? Asthma Paternal Uncle    ? Cancer Maternal Grandfather    ? Heart Disease Paternal Grandfather    ? Heart Attack Brother    ? None Reported Father        Social History     Socioeconomic History   ? Marital status: Married   Tobacco Use   ? Smoking status: Never   ? Smokeless tobacco: Never   Substance and Sexual Activity   ? Alcohol use: Yes     Alcohol/week: 6.0 standard drinks     Types: 6 Cans of beer per week   ? Drug use: Never   ? Sexual activity: Not Currently     Partners: Male     Birth control/protection: None       Objective:         ? ASPIRIN PO Take 81 mg by mouth daily.   ? darbepoetin alfa (ARANESP) 300 mcg/0.6 mL syrg Inject 0.6 mL under the skin as Needed.   ? gabapentin (NEURONTIN) 300 mg capsule Take one capsule by mouth daily.   ? glucagon (BAQSIMI) 3 mg/actuation nasal spray Use 1 spray in single nostril. If no response, may repeat in 15 minutes using second device.   ? insulin pump -ASPART- Patients Own by [employer] Pump route Three times daily with meals and as needed.   ? levothyroxine (SYNTHROID) 100 mcg tablet Take one tablet by mouth daily.   ? lisinopriL (ZESTRIL) 5 mg tablet Take one tablet by mouth daily.   ? rosuvastatin (CRESTOR) 40 mg tablet Take one tablet by mouth daily.           Physical Exam  Constitutional:       Appearance: She is well-developed.   Eyes:      General: No scleral icterus.     Pupils: Pupils are equal, round, and reactive to light.   Neck:      Thyroid: No thyromegaly.      Vascular: No JVD.   Cardiovascular:      Rate and Rhythm: Normal rate and regular rhythm.      Heart sounds: Normal heart sounds. No murmur heard.    No friction rub.   Pulmonary:      Effort: No respiratory distress.      Breath sounds: Normal breath sounds. No wheezing or rales.   Abdominal:      General: Bowel sounds are normal. There is no distension.      Palpations: Abdomen is soft. There is no mass.      Tenderness: There is no abdominal tenderness.   Musculoskeletal:      Cervical back: Neck supple.   Lymphadenopathy:      Cervical: No cervical adenopathy.   Skin:     General: Skin is warm.  Coloration: Skin is not pale.      Findings: No erythema or rash.   Neurological:      Mental Status: She is alert and oriented to person, place, and time.              Assessment:    Chronic kidney disease stage III:  -Main risk factor is diabetes type 2  -No hypertension no NSAIDs use  -She has microalbuminuria  -Prior renal imaging in [place] showed unremarkable kidneys  ?  Hypertension:  -Reasonable control  -On lisinopril  ?  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  Anemia:  -Previously required Aranesp, last in 2021  -CKD is likely contributing  -Replete iron stores in June 2021    Plan  -Check urine microalbumin/creatinine every 6 months  -Obtain BMP every 6 months  -Check PTH and Vitamin D 25  -Check CBC and iron studies  -Instructed to avoid NSAIDs  -Educated about low-salt diet  -Increase oral hydration  -Continue lisinopril. Plan to titrate dose up if she has persistent microalbuminuria.   -Aranesp +/- IV iron of needed to keep Hgb above 10.0  -Discussed the importance of diabetes control to delay CKD progression

## 2021-04-28 ENCOUNTER — Encounter: Admit: 2021-04-28 | Discharge: 2021-04-28 | Payer: Private Health Insurance - Indemnity

## 2021-05-30 ENCOUNTER — Ambulatory Visit: Admit: 2021-05-30 | Discharge: 2021-05-30 | Payer: Private Health Insurance - Indemnity

## 2021-05-30 ENCOUNTER — Encounter: Admit: 2021-05-30 | Discharge: 2021-05-30 | Payer: Private Health Insurance - Indemnity

## 2021-05-30 DIAGNOSIS — Z1231 Encounter for screening mammogram for malignant neoplasm of breast: Secondary | ICD-10-CM

## 2021-06-11 ENCOUNTER — Encounter: Admit: 2021-06-11 | Discharge: 2021-06-11 | Payer: Private Health Insurance - Indemnity

## 2021-07-18 ENCOUNTER — Encounter: Admit: 2021-07-18 | Discharge: 2021-07-18 | Payer: Private Health Insurance - Indemnity

## 2021-07-18 DIAGNOSIS — E782 Mixed hyperlipidemia: Secondary | ICD-10-CM

## 2021-07-18 DIAGNOSIS — Z0000 Encounter for general adult medical examination without abnormal findings: Secondary | ICD-10-CM

## 2021-07-18 DIAGNOSIS — E1065 Type 1 diabetes mellitus with hyperglycemia: Secondary | ICD-10-CM

## 2021-07-18 DIAGNOSIS — E039 Hypothyroidism, unspecified: Secondary | ICD-10-CM

## 2021-07-18 NOTE — Progress Notes
Date of service 07/18/2021    Lori Frye presents today for type 1 DM c/b nephropathy, hld, hypothyroidism, a physical.    Patient Reported Other  She presents today for a physical.  I last saw her a year ago for her physical establish care appt.  She has been diabetic for at least 35 years.  She has an insulin pump which was managed by Dr. Vania Rea in the [place] [facility] and then switched to [facility] Roger Shelter MD). She over the past few years has been aware of having chronic kidney disease.  She did see a nephrologist prior to moving here from [place] in 2020.  In March 2022 she did establish care with Dr. Gretchen Short in the [facility] nephrology clinic.      She also has history of anemia. ?She has been intermittently on Aranesp. ?She was seen in the hematology clinic by Dr. Benjiman Core in June 2021.  Note she had for Aranesp injections in [place] since their criteria was hemoglobin of 11.  Here through [place] it is 10.    She also has a history of diabetic peripheral neuropathy.  She has been on gabapentin for years.  She has no discomfort or pain from this on the medication. She does not note progression.    History multinodular goiter/thyroid nodules.  Last ultrasound that I could find was from 2019 in [place].  It noted that 2 of the nodules have been biopsied and the other met criteria for follow-up in a year. Discussed w/ her last year it would be most appropriate for Dr. Valentina Lucks to follow the thyroid nodules. She notes she has a thyroid scan scheduled for July 2023.    She had a DEXA in 6/21 which showed osteopenia with FRAX risk score lower than risk criteria to treat with prescription medication.    Summary of interim medical encounters since last physical:  Hem/Onc 01/20/21: observe hgb, f/u annually; hbg 10.6 01/20/21    Nephrology 04/23/21 Plan  -Check urine microalbumin/creatinine and BMP every 6 months  -Check PTH and Vitamin D 25  -Check CBC and iron studies  -Instructed to avoid NSAIDs  -Educated about low-salt diet  -Increase oral hydration  -Continue lisinopril. Plan to titrate dose up if she has persistent microalbuminuria.   -Aranesp +/- IV iron of needed to keep Hgb above 10.0  -Discussed the importance of diabetes control to delay CKD progression    Endo 05/14/21: continue lisinopril, crestor, consider cardioscan in the future    She notes that she is generally feeling well.  She has been trying to slowly lose weight and it appears from her weight values in the chart that she has been successful with this.  She does have trouble with hypoglycemia even with going out for a walk for exercise and that has always been a challenge.  She has discussed this with Dr. Valentina Lucks who is managing her insulin pump. She has an eye exam scheduled soon. ([place]).  She is still working full-time.  She has no health-related complaints today.      Medical History:   Diagnosis Date   ? Acquired hypothyroidism    ? Anemia in stage 3a chronic kidney disease (HCC) 08/14/2019    Here for hematologic opinion regarding anemia.  She had been living in [place] most recently but recently moved back to the [facility] area and presents to establish hematologic follow-up.  She was aware that she was anemic as far back as 2011 or  2012 when she was living in [place].  She recalls being evaluated by a hematologist but shortly thereafter moved to [facility].  She was ul   ? Arthritis 2018   ? Cataract 2016   ? CKD (chronic kidney disease)    ? Diabetes mellitus (HCC)    ? Hyperlipemia    ? Multiple thyroid nodules 07/02/2020   ? Osteopenia 07/02/2020   ? Uncontrolled type 1 diabetes mellitus with hyperglycemia, with long-term current use of insulin (HCC) 10/26/2019   ? Unspecified deficiency anemia 2012     Surgical History:   Procedure Laterality Date   ? HX SALPINGO-OOPHORECTOMY  2009   ? HX HYSTERECTOMY  2011   ? COLONOSCOPY     ? HX APPENDECTOMY     ? HX BREAST AUGMENTATION     ? HX CESAREAN SECTION     ? HX CHOLECYSTECTOMY     ? HX COSMETIC SURGERY     ? HX EYE SURGERY     ? HX SURGERY      De Quervain's release    ? HYSTERECTOMY     ? SURGERY      trigger finger release     Family History   Problem Relation Age of Onset   ? Thyroid Disease Mother    ? Thyroid Disease Brother    ? Asthma Paternal Uncle    ? Cancer Maternal Grandfather    ? Heart Disease Paternal Grandfather    ? Heart Attack Brother    ? None Reported Father      Social History     Tobacco Use   ? Smoking status: Never   ? Smokeless tobacco: Never   Substance Use Topics   ? Alcohol use: Yes     Alcohol/week: 6.0 standard drinks of alcohol     Types: 6 Cans of beer per week   Married to [place] (married twice before once divorced once widowed).  Has 2 children ages 54 and 29.  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  Does not exercise regularly.  Feels safe at home.  Wears her seatbelt regularly.  She and her husband like to ride motorcycles.  She has a 18-year-old and 25-year-old grandchild in the area which is why she moved back from [facility].    Review of Systems   Constitutional: Negative for chills, decreased appetite, diaphoresis, fever, malaise/fatigue, night sweats and weight gain.   HENT: Negative for congestion, ear pain, hearing loss and sore throat.    Eyes: Negative for visual disturbance.   Cardiovascular: Negative for chest pain, dyspnea on exertion and leg swelling.   Respiratory: Negative for cough and shortness of breath.    Skin: Negative for rash.   Musculoskeletal: Negative for joint swelling, myalgias and neck pain.   Gastrointestinal: Negative for abdominal pain, anorexia, change in bowel habit, constipation, diarrhea, melena, nausea and vomiting.   Genitourinary: Negative for bladder incontinence and dysuria.   Neurological: Negative for headaches, light-headedness, numbness, vertigo and weakness.   Psychiatric/Behavioral: Negative for depression. The patient is not nervous/anxious.        Allergies Allergen Reactions   ? Pcn [Penicillins] HIVES   ? Sulfa (Sulfonamide Antibiotics) HIVES       Objective:     ? ASPIRIN PO Take 81 mg by mouth daily.   ? darbepoetin alfa (ARANESP) 300 mcg/0.6 mL syrg Inject 0.6 mL under the skin as Needed.   ? gabapentin (NEURONTIN) 300 mg capsule Take  one capsule by mouth daily.   ? glucagon (BAQSIMI) 3 mg/actuation nasal spray Use 1 spray in single nostril. If no response, may repeat in 15 minutes using second device.   ? insulin pump -ASPART- Patients Own by SubQ Pump route Three times daily with meals and as needed.   ? levothyroxine (SYNTHROID) 100 mcg tablet Take one tablet by mouth daily.   ? lisinopriL (ZESTRIL) 5 mg tablet Take one tablet by mouth daily.   ? rosuvastatin (CRESTOR) 40 mg tablet Take one tablet by mouth daily.     Vitals:    07/21/21 0755   BP: 109/73   BP Source: Arm, Right Upper   Pulse: 83   Resp: 16   Weight: 89.8 kg (198 lb)     Depression:  Patient Scores:  PHQ-2: PHQ-2 Score: 0 (07/21/2021  7:54 AM)    PHQ-9: No data recorded  Interventions:  PHQ-2: PHQ-2 Score less than 3: No follow-up or recommendations are necessary at this time (07/21/2021  7:54 AM)    BMI:  Body mass index is 31.01 kg/m?Marland Kitchen  Plan in Progress: BMI Plan is in Progress (07/21/2021  8:37 AM)      Physical Exam     General: Pleasant well developed, well nourished obese female in no acute distress. Heent: Pupils equal round, reactive to light. Extraocular movements intact. Mucous membranes moist, oropharynx, TMs clear. Neck: No LAD, thyromegaly or jvp. Lungs: clear throughout, normal respiratory effort. CV: Regular rate and rhythm, no murmur. 2+ distal pulses. Abdomen: Soft, non-tender, non-distended, +BS, no masses. Insulin pump noted. Ext: no clubbing, cyanosis or edema. Warm and dry. Neuro: Alert and oriented x 3; CNII-XII intact, no focal deficits. Psy: Mood: good. Affect: normal.    Diabetic Foot Exam       Bilateral vascular, sensation, integument are normal:  Yes except decreased sensation to monofilament below L 1st/2nd toes    Assessment and Plan:  Wellness exam: Clinical exam within normal limits other than elevated BMI.  Near-fasting labs today.  Cologuard 09/10/20, nl, q 3 yrs, mammogram 05/30/21, annually.  She is status post complete hysterectomy and BSO for benign causes.  No Pap smears needed.  Tdap 07/19/20, q 10 yrs.  Her last COVID booster was less than 6 months ago. Biv covid booster recommended-pt declined. She did have both Shingrix vaccines. Annual flu vaccine in the fall.  Problem list:  1.  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  Follow-up with diabetes specialist and she is on an insulin pump.  Continue gabapentin for neuropathy management.  Continue close follow-up with her eye specialist Dr. Earle Gell at [place]. Dil eye exam will be updated soon per pt. Ctn ace-I, statin, asa.  2.  Hypertension.  Controlled on lisinopril.  3.  Hyperlipidemia.  On max dose of Crestor.  Update lipids with labs soon.  4.  Hypothyroidism.  On levothyroxine.  Update thyroid function.  5.  Goiter and thyroid nodule history.  This will be followed by her endocrinologist, Dr. Valentina Lucks. Repeat u/s planned for July 2023.  6.  CKD stage III.  She is established with Dr. Gretchen Short.  Avoid NSAIDs.  7.  Nonmorbid obesity.  Healthy diet--helping w/ wt loss; regular exercise recommended, however, recommended she discuss pump settings with Dr. Valentina Lucks before starting exercise program d/t propensity to become hypoglycemic even w/ going for a walk.    1 yr-sooner if needed

## 2021-07-20 ENCOUNTER — Encounter: Admit: 2021-07-20 | Discharge: 2021-07-20 | Payer: Private Health Insurance - Indemnity

## 2021-07-20 DIAGNOSIS — Z0000 Encounter for general adult medical examination without abnormal findings: Secondary | ICD-10-CM

## 2021-07-21 ENCOUNTER — Encounter: Admit: 2021-07-21 | Discharge: 2021-07-21 | Payer: Private Health Insurance - Indemnity

## 2021-07-21 ENCOUNTER — Ambulatory Visit: Admit: 2021-07-21 | Discharge: 2021-07-22 | Payer: Private Health Insurance - Indemnity

## 2021-07-21 DIAGNOSIS — M8580 Other specified disorders of bone density and structure, unspecified site: Secondary | ICD-10-CM

## 2021-07-21 DIAGNOSIS — N189 Chronic kidney disease, unspecified: Secondary | ICD-10-CM

## 2021-07-21 DIAGNOSIS — E119 Type 2 diabetes mellitus without complications: Secondary | ICD-10-CM

## 2021-07-21 DIAGNOSIS — N1831 Anemia in stage 3a chronic kidney disease (HCC): Secondary | ICD-10-CM

## 2021-07-21 DIAGNOSIS — D539 Nutritional anemia, unspecified: Secondary | ICD-10-CM

## 2021-07-21 DIAGNOSIS — M1990 Unspecified osteoarthritis, unspecified site: Secondary | ICD-10-CM

## 2021-07-21 DIAGNOSIS — E039 Hypothyroidism, unspecified: Secondary | ICD-10-CM

## 2021-07-21 DIAGNOSIS — E1065 Type 1 diabetes mellitus with hyperglycemia: Secondary | ICD-10-CM

## 2021-07-21 DIAGNOSIS — H269 Unspecified cataract: Secondary | ICD-10-CM

## 2021-07-21 DIAGNOSIS — E113519 Type 2 diabetes mellitus with proliferative diabetic retinopathy with macular edema, unspecified eye: Secondary | ICD-10-CM

## 2021-07-21 DIAGNOSIS — E042 Nontoxic multinodular goiter: Secondary | ICD-10-CM

## 2021-07-21 DIAGNOSIS — E785 Hyperlipidemia, unspecified: Secondary | ICD-10-CM

## 2021-07-21 LAB — COMPREHENSIVE METABOLIC PANEL
ALBUMIN: 4.3 g/dL (ref 3.5–5.0)
ALK PHOSPHATASE: 52 U/L (ref 25–110)
ALT: 14 U/L (ref 7–56)
ANION GAP: 10 (ref 3–12)
AST: 19 U/L (ref 7–40)
CO2: 26 MMOL/L (ref 21–30)
EGFR: 44 mL/min — ABNORMAL LOW (ref >60)
SODIUM: 138 MMOL/L — ABNORMAL LOW (ref >60)
TOTAL BILIRUBIN: 0.4 mg/dL (ref 0.3–1.2)
TOTAL PROTEIN: 7 g/dL (ref 6.0–8.0)

## 2021-07-21 LAB — LIPID PROFILE
CHOLESTEROL: 181 mg/dL — ABNORMAL LOW (ref <200)
HDL: 70 mg/dL — ABNORMAL HIGH (ref >40)
LDL: 97 mg/dL — ABNORMAL HIGH (ref <100)
NON HDL CHOLESTEROL: 111 mg/dL (ref 8.5–10.6)
VLDL: 13 mg/dL — ABNORMAL HIGH (ref 0.4–1.00)

## 2021-07-21 LAB — CBC: WBC COUNT: 5.3 K/UL (ref 4.5–11.0)

## 2021-07-21 LAB — TSH WITH FREE T4 REFLEX: TSH: 1.5 uU/mL — ABNORMAL LOW (ref <150)

## 2021-07-22 DIAGNOSIS — E782 Mixed hyperlipidemia: Secondary | ICD-10-CM

## 2021-07-22 DIAGNOSIS — Z0000 Encounter for general adult medical examination without abnormal findings: Secondary | ICD-10-CM

## 2021-07-22 DIAGNOSIS — E1065 Type 1 diabetes mellitus with hyperglycemia: Secondary | ICD-10-CM

## 2021-07-22 DIAGNOSIS — E039 Hypothyroidism, unspecified: Secondary | ICD-10-CM

## 2021-09-03 ENCOUNTER — Other Ambulatory Visit: Payer: Self-pay | Admitting: Internal Medicine

## 2021-09-03 DIAGNOSIS — Z1231 Encounter for screening mammogram for malignant neoplasm of breast: Secondary | ICD-10-CM

## 2021-09-24 ENCOUNTER — Encounter: Admit: 2021-09-24 | Discharge: 2021-09-24 | Payer: Private Health Insurance - Indemnity

## 2021-11-26 ENCOUNTER — Encounter: Admit: 2021-11-26 | Discharge: 2021-11-26 | Payer: Private Health Insurance - Indemnity

## 2021-12-23 ENCOUNTER — Encounter: Admit: 2021-12-23 | Discharge: 2021-12-23 | Payer: Private Health Insurance - Indemnity

## 2022-01-19 ENCOUNTER — Encounter: Admit: 2022-01-19 | Discharge: 2022-01-19 | Payer: Private Health Insurance - Indemnity

## 2022-01-21 ENCOUNTER — Encounter: Admit: 2022-01-21 | Discharge: 2022-01-21 | Payer: Private Health Insurance - Indemnity

## 2022-01-21 MED ORDER — MOLNUPIRAVIR 200 MG PO CAP
800 mg | ORAL_CAPSULE | Freq: Two times a day (BID) | ORAL | 0 refills | Status: AC
Start: 2022-01-21 — End: ?

## 2022-02-18 ENCOUNTER — Encounter: Admit: 2022-02-18 | Discharge: 2022-02-18 | Payer: Private Health Insurance - Indemnity

## 2022-02-24 ENCOUNTER — Encounter: Admit: 2022-02-24 | Discharge: 2022-02-24 | Payer: Private Health Insurance - Indemnity

## 2022-02-24 DIAGNOSIS — E1065 Type 1 diabetes mellitus with hyperglycemia: Secondary | ICD-10-CM

## 2022-02-24 DIAGNOSIS — N1831 Anemia in stage 3a chronic kidney disease: Secondary | ICD-10-CM

## 2022-02-24 DIAGNOSIS — E039 Hypothyroidism, unspecified: Secondary | ICD-10-CM

## 2022-02-24 DIAGNOSIS — E042 Nontoxic multinodular goiter: Secondary | ICD-10-CM

## 2022-02-24 DIAGNOSIS — D539 Nutritional anemia, unspecified: Secondary | ICD-10-CM

## 2022-02-24 DIAGNOSIS — M1990 Unspecified osteoarthritis, unspecified site: Secondary | ICD-10-CM

## 2022-02-24 DIAGNOSIS — M8580 Other specified disorders of bone density and structure, unspecified site: Secondary | ICD-10-CM

## 2022-02-24 DIAGNOSIS — E785 Hyperlipidemia, unspecified: Secondary | ICD-10-CM

## 2022-02-24 DIAGNOSIS — N189 Chronic kidney disease, unspecified: Secondary | ICD-10-CM

## 2022-02-24 DIAGNOSIS — E119 Type 2 diabetes mellitus without complications: Secondary | ICD-10-CM

## 2022-02-24 DIAGNOSIS — H269 Unspecified cataract: Secondary | ICD-10-CM

## 2022-02-24 LAB — CBC AND DIFF
ABSOLUTE BASO COUNT: 0 K/UL (ref 0–0.20)
ABSOLUTE EOS COUNT: 0.1 K/UL (ref 0–0.45)
ABSOLUTE LYMPH COUNT: 1 K/UL (ref 1.0–4.8)
ABSOLUTE MONO COUNT: 0.5 K/UL (ref 0–0.80)
ABSOLUTE NEUTROPHIL: 3.6 K/UL (ref 1.8–7.0)
BASOPHILS %: 1 % (ref 0–2)
EOSINOPHILS %: 2 % (ref 0–5)
HEMATOCRIT: 31 % — ABNORMAL LOW (ref 36–45)
HEMOGLOBIN: 10 g/dL — ABNORMAL LOW (ref 12.0–15.0)
LYMPHOCYTES %: 19 % — ABNORMAL LOW (ref 24–44)
MCH: 29 pg (ref 26–34)
MCHC: 33 g/dL (ref 32.0–36.0)
MCV: 90 FL (ref 80–100)
MONOCYTES %: 9 % (ref 4–12)
MPV: 8.1 FL (ref 7–11)
NEUTROPHILS %: 69 % (ref 41–77)
PLATELET COUNT: 239 K/UL (ref 150–400)
RBC COUNT: 3.4 M/UL — ABNORMAL LOW (ref 4.0–5.0)
RDW: 13 % (ref 11–15)
WBC COUNT: 5.2 K/UL (ref 4.5–11.0)

## 2022-02-24 NOTE — Progress Notes
Date of Service: 02/24/2022      Subjective:             Reason for Visit:  Follow Up      Lori Frye is a 62 y.o. female.     Cancer Staging   No matching staging information was found for the patient.    History of Present Illness    Problem   Anemia in Stage 3a Chronic Kidney Disease     Here for hematologic opinion regarding anemia.    She had been living in [place] most recently but recently moved back to the [facility] area and presents to establish hematologic follow-up.    She was aware that she was anemic as far back as 2011 or 2012 when she was living in [place].  She recalls being evaluated by a hematologist but shortly thereafter moved to [facility].    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  It looks like she had a hemoglobin of around 10.8.  The remainder of her CBC was completely normal and she did have an erythropoietin level that was only 4.  Based on that she has been supplemented with Aranesp and only requires injections about twice a year.    She had labs completed recently at [facility]. [facility] when she was seeing endocrinology in June 1 her hemoglobin was 11 with a normal white blood cell count and platelet count and unremarkable differential and red cell indices.  Creatinine at that time was 1.1.    She actually feels pretty good right now.  She has longstanding diabetes for over 30 years but it is well managed.  She is also on thyroid replacement, medicine for cholesterol and an ACE inhibitor.    She has beer occasionally but does not drink heavily.  She is a non-smoker there is no other drug use.  She is not aware of any family history of hematologic or kidney disorders and she has seen a nephrologist in the past and plans to establish with a nephrologist at some point.    I saw her for the first time in June 2021 when her hemoglobin was 10.9.  Folate and B12 and iron stores were normal erythropoietin was 12.7 with a creatinine of 1.26 and a GFR of 44 mL/min.  Protein electrophoresis and free light chains were normal.  She has been evaluated by nephrology.    Overall she has been feeling well with no new changes.  She reports her blood sugars have been doing well.    February 19, 2020 hemoglobin 10.7 with the remainder of her CBC unchanged.  Ferritin was 286 with an iron saturation of 28%.    January 20, 2021 hemoglobin 10.6 with otherwise unremarkable CBC.  She did have an iron panel November 19, 2020 with ferritin 271 and an iron saturation of 22%.  Most recent creatinine November 19, 2020 1.21 with an estimated GFR of 52.    She has had a pretty uneventful year and reports that her kidney function has remained stable in general.  No new significant medication changes and she is wearing her insulin pump and that seems to be doing a good job of controlling her sugars.            Review of Systems   All other systems reviewed and are negative.        Objective:          ASPIRIN PO Take 81 mg by mouth  daily.    gabapentin (NEURONTIN) 300 mg capsule Take one capsule by mouth daily.    glucagon (BAQSIMI) 3 mg/actuation nasal spray Use 1 spray in single nostril. If no response, may repeat in 15 minutes using second device.    insulin pump -ASPART- Patients Own by SubQ Pump route Three times daily with meals and as needed.    levothyroxine (SYNTHROID) 100 mcg tablet Take one tablet by mouth daily.    lisinopriL (ZESTRIL) 5 mg tablet Take one tablet by mouth daily.    rosuvastatin (CRESTOR) 40 mg tablet Take one tablet by mouth daily.     Vitals:    02/24/22 0957 02/24/22 0958   BP: 128/50    BP Source: Arm, Right Upper Arm, Right Upper   Pulse: 86    Temp: 36.7 ?C (98.1 ?F)    Resp: 18    SpO2: 99%    O2 Device:  None (Room air)   TempSrc: Oral Oral   PainSc: Zero Zero   Weight: 89.1 kg (196 lb 6.4 oz)    Height: 170.5 cm (5' 7.13)      Body mass index is 30.65 kg/m?Marland Kitchen     Pain Score: Zero         Pain Addressed:  N/A    Patient Evaluated for a Clinical Trial: No treatment clinical trial available for this patient.     [place] Cooperative Oncology Group performance status is 0, Fully active, able to carry on all pre-disease performance without restriction.Marland Kitchen     Physical Exam  Vitals reviewed.   Constitutional:       General: She is not in acute distress.     Appearance: She is well-developed.   HENT:      Head: Normocephalic and atraumatic.   Eyes:      General: No scleral icterus.     Conjunctiva/sclera: Conjunctivae normal.   Neck:      Thyroid: No thyromegaly.   Cardiovascular:      Rate and Rhythm: Normal rate and regular rhythm.      Heart sounds: Normal heart sounds.   Pulmonary:      Effort: Pulmonary effort is normal.      Breath sounds: Normal breath sounds.   Abdominal:      General: Bowel sounds are normal. There is no distension.      Palpations: Abdomen is soft. There is no mass.   Musculoskeletal:         General: No swelling. Normal range of motion.      Cervical back: Neck supple.   Lymphadenopathy:      Cervical: No cervical adenopathy.      Upper Body:      Right upper body: No supraclavicular or axillary adenopathy.      Left upper body: No supraclavicular or axillary adenopathy.   Skin:     General: Skin is warm and dry.   Neurological:      General: No focal deficit present.      Mental Status: She is alert and oriented to person, place, and time.   Psychiatric:         Mood and Affect: Mood normal.         Behavior: Behavior normal.         Thought Content: Thought content normal.         Judgment: Judgment normal.            Results for orders placed or performed during the hospital encounter of  02/24/22 (from the past 336 hour(s))   CBC AND DIFF   Result Value Ref Range    White Blood Cells 5.2 4.5 - 11.0 K/UL    RBC 3.44 (L) 4.0 - 5.0 M/UL    Hemoglobin 10.3 (L) 12.0 - 15.0 GM/DL    Hematocrit 16.1 (L) 36 - 45 %    MCV 90.2 80 - 100 FL    MCH 29.8 26 - 34 PG    MCHC 33.0 32.0 - 36.0 G/DL    RDW 09.6 11 - 15 %    Platelet Count 239 150 - 400 K/UL MPV 8.1 7 - 11 FL    Neutrophils 69 41 - 77 %    Lymphocytes 19 (L) 24 - 44 %    Monocytes 9 4 - 12 %    Eosinophils 2 0 - 5 %    Basophils 1 0 - 2 %    Absolute Neutrophil Count 3.60 1.8 - 7.0 K/UL    Absolute Lymph Count 1.00 1.0 - 4.8 K/UL    Absolute Monocyte Count 0.50 0 - 0.80 K/UL    Absolute Eosinophil Count 0.10 0 - 0.45 K/UL    Absolute Basophil Count 0.00 0 - 0.20 K/UL          Assessment and Plan:    Problem List Items Addressed This Visit       Anemia in stage 3a chronic kidney disease  - Primary     Chronic normocytic anemia.  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  She has had GFR is in the 45-60 range in the past.  I have explained to her that typically with anemia of chronic kidney disease we do not institute if the hemoglobin is above 10 and treat to maintain a hemoglobin just over 10 based on some of the other historical safety data.  We have elected for observation her hemoglobin is stayed fairly stable in about the 10.5 range.  Therefore she still does not require for Red cell growth factors.  We have discussed continued annual follow-up and I will update B12 folate and iron panel next year.    In regards to her chronic kidney disease and diabetes she continues to work on these things and she follows with nephrology.      She verbalized understanding of the discussion and did not have any other questions.         Relevant Orders    [facility] CLINIC APPT REQUEST    [facility] LAB APPT REQ    CBC AND DIFF    VITAMIN B12    FOLATE, SERUM    IRON + BINDING CAPACITY + %SAT+ FERRITIN                This note was created with partial dictation using a dragon dictation system, please notify us if you notice any errors of omissions or content.

## 2022-04-01 ENCOUNTER — Encounter: Admit: 2022-04-01 | Discharge: 2022-04-01 | Payer: Private Health Insurance - Indemnity

## 2022-04-08 ENCOUNTER — Encounter: Admit: 2022-04-08 | Discharge: 2022-04-08 | Payer: Private Health Insurance - Indemnity

## 2022-04-09 ENCOUNTER — Encounter: Admit: 2022-04-09 | Discharge: 2022-04-09 | Payer: Private Health Insurance - Indemnity

## 2022-04-09 NOTE — Telephone Encounter
Hospital Discharge Follow Up      Reached Patient: Yes, patient was identified, for their safety, using dual identification of name and date of birth  Patient Date of Birth: [birth year]     Admission Information:     Hospital [facility]: [facility]  Admission Date: 04/06/22    Discharge Date: 04/08/22    Admission Diagnosis:  Right flank pain  Discharge Diagnosis: Pyelonephritis (Primary Dx);   Right Hydroureteronephrosis   Has there been a discharge within the last 30 days? No   Hospital Services: Unplanned  Today's call is 1 (business) days post discharge      Discharge Instruction Review   Did patient receive and understand discharge instructions? Yes    Home Health ordered? No  Caregiver assistance in the home? Yes   Are there concerns regarding the patient's ADL'S? No  Is patient a fall risk? No    Special diet? No      Medication Reconciliation    Changes to pre-hospital medications? Yes    New Medications    Indication(s)  cefpodoxime 200 MG tablet  Commonly known as: VANTIN  200 mg, Oral, 2 times daily      Were new prescriptions filled? Yes  Meds reviewed and reconciled? Yes    Current Outpatient Medications   Medication Instructions    ASPIRIN PO 81 mg, Oral, DAILY    gabapentin (NEURONTIN) 300 mg, Oral, DAILY    glucagon (BAQSIMI) 3 mg/actuation nasal spray Use 1 spray in single nostril. If no response, may repeat in 15 minutes using second device.    insulin pump -ASPART- Patients Own SubQ Pump, THREE TIMES DAILY WITH MEALS & PRN    levothyroxine (SYNTHROID) 100 mcg, Oral, DAILY    lisinopril (ZESTRIL) 5 mg, Oral, DAILY    rosuvastatin (CRESTOR) 40 mg, Oral, DAILY         Understanding Condition   Having any current symptoms? No. Pt denies any urinary sx, tolerating abx well, no addl concerns, back at work today. Will f/u with PCP next week.    Do you have a history of Heart Failure? No    Patient understands when to seek additional medical care? Yes   Other items discussed: follow up Scheduling Follow-up Appointment   Upcoming appointments:   Future Appointments   Date Time Provider Department Center   04/15/2022  7:00 AM Shanda Howells, APRN-NP [facility]   07/24/2022  8:00 AM Nedra Hai, MD [facility]   02/26/2023 12:30 PM [facility] None   02/26/2023  1:00 PM Corky Mull, MD [facility] Exam     Does the patient require 7 day follow up appointment? No  Hospital Follow-Up scheduled with PCP? Yes, Date: 04/15/22   When was patient?s last PCP visit: 07/21/2021   PCP primary location: [facility]  Specialist appointment scheduled? No  Is assistance with transportation needed? No   MyChart message sent? Active in MyChart. No message sent.   Artera text sent? No    Doreatha Martin, RN

## 2022-04-10 ENCOUNTER — Ambulatory Visit: Admit: 2022-04-10 | Discharge: 2022-04-11 | Payer: Private Health Insurance - Indemnity

## 2022-04-10 ENCOUNTER — Encounter: Admit: 2022-04-10 | Discharge: 2022-04-10 | Payer: Private Health Insurance - Indemnity

## 2022-04-10 DIAGNOSIS — E119 Type 2 diabetes mellitus without complications: Secondary | ICD-10-CM

## 2022-04-10 DIAGNOSIS — E785 Hyperlipidemia, unspecified: Secondary | ICD-10-CM

## 2022-04-10 DIAGNOSIS — E039 Hypothyroidism, unspecified: Secondary | ICD-10-CM

## 2022-04-10 DIAGNOSIS — N12 Tubulo-interstitial nephritis, not specified as acute or chronic: Secondary | ICD-10-CM

## 2022-04-10 DIAGNOSIS — E113519 Type 2 diabetes mellitus with proliferative diabetic retinopathy with macular edema, unspecified eye: Secondary | ICD-10-CM

## 2022-04-10 DIAGNOSIS — D539 Nutritional anemia, unspecified: Secondary | ICD-10-CM

## 2022-04-10 DIAGNOSIS — E1065 Type 1 diabetes mellitus with hyperglycemia: Secondary | ICD-10-CM

## 2022-04-10 DIAGNOSIS — Z09 Encounter for follow-up examination after completed treatment for conditions other than malignant neoplasm: Secondary | ICD-10-CM

## 2022-04-10 DIAGNOSIS — N1831 Anemia in stage 3a chronic kidney disease: Secondary | ICD-10-CM

## 2022-04-10 DIAGNOSIS — E042 Nontoxic multinodular goiter: Secondary | ICD-10-CM

## 2022-04-10 DIAGNOSIS — H269 Unspecified cataract: Secondary | ICD-10-CM

## 2022-04-10 DIAGNOSIS — M8580 Other specified disorders of bone density and structure, unspecified site: Secondary | ICD-10-CM

## 2022-04-10 DIAGNOSIS — E1142 Type 2 diabetes mellitus with diabetic polyneuropathy: Secondary | ICD-10-CM

## 2022-04-10 DIAGNOSIS — N189 Chronic kidney disease, unspecified: Secondary | ICD-10-CM

## 2022-04-10 DIAGNOSIS — M1990 Unspecified osteoarthritis, unspecified site: Secondary | ICD-10-CM

## 2022-04-10 NOTE — Progress Notes
Date of Service: 04/10/2022    Lori Frye is a 62 y.o. female.  DOB: 06-Oct-1960  MRN: [id number]     Subjective:             History of Present Illness  Chief Complaint   Patient presents with    Post-hospital Follow Up     Lori Frye is here for hospital discharge follow-up and transitional care management.  She was recently hospitalized at [facility] for pyelonephritis.  She remained in the hospital from 04/06/2022 through 04/08/2022.  See brief hospital course below:    This is a 62 year old female with a history of type 1 diabetes, CKD, and hypothyroidism who presented on 2/19 with right-sided flank and groin pain. She was discovered to have pyuria and bacteriuria. On CT scan of the abdomen and pelvis, she was found to have right hydroureteronephrosis with urothelial thickening and periureteral stranding. Her renal function was stable. She was started on IV ceftriaxone and seen in consultation by urology. Urology did not recommend surgical intervention at this time, but will need a repeat renal ultrasound in 6 weeks. The patient improved significantly with IVF and IV abx. Her initial urine culture and blood culture are both negative at 24 hours. She was stable for discharge from the hospital on 2/21. She was given a 10-day course of cefpodoxime for suspected pyelonephritis. In the event that her urine culture has some delayed growth, her antibiotics could need to be adjusted.    Interval history:    Lori Frye is doing much better.  She  feels 100% today.  Tolerating the antibiotics just fine.  Eating and drinking normally.  Denies any side effect to the antibiotic.  No fever.  Back pain has resolved. Urine culture at [facility] still showing no growth at 1 day without a final result.    She is followed by Dr. Valentina Lucks ([facility]).  A1c done earlier this month and was 7.6.  She recently got a new insulin pump.  Doing well on her current regimen.  She has diabetic retinopathy and has an upcoming appointment with her eye doctor.    HM:  -Eye exam coming up with Dr. Lucita Lora at [facility]  -Will do Covid booster next fall.          Constitutional: energy level good.  Weight stable. No fever.  No chills.  Eyes:  No vision problems, no drainage.  Ears/Nose/Mouth/Throat:  No hearing difficulties.  No congestion.  No oral ulcers.  No sore throat.  No difficulty swallowing.  CV: No chest pain or palpitations.  Resp: No shortness of breath or cough.  GI: No abdominal pain.  Bowels normal, with no hematochezia or melena.  GU: no dysuria.  No urinary frequency.  No significant incontinence.  MS:  No significant joint or muscle pain.  SKIN:  Has noted no new moles or changes in moles.  NEURO:  No tingling, numbness, weakness, or other complaints.  PSYCH: Mood good, denies depression or anxiety.  Sleeps well.            Objective:          ASPIRIN PO Take 81 mg by mouth daily.    cefpodoxime (VANTIN) 200 mg tablet Take one tablet by mouth twice daily.    gabapentin (NEURONTIN) 300 mg capsule Take one capsule by mouth daily.    glucagon (BAQSIMI) 3 mg/actuation nasal spray Use 1 spray in single nostril. If no response, may repeat in 15 minutes  using second device.    insulin pump -ASPART- Patients Own by SubQ Pump route Three times daily with meals and as needed.    levothyroxine (SYNTHROID) 100 mcg tablet Take one tablet by mouth daily.    lisinopriL (ZESTRIL) 5 mg tablet Take one tablet by mouth daily.    rosuvastatin (CRESTOR) 40 mg tablet Take one tablet by mouth daily.     Vitals:    04/10/22 0711   BP: 123/77   Pulse: 80   Temp: 36.5 ?C (97.7 ?F)   Resp: 18   SpO2: 98%   TempSrc: Temporal   PainSc: Zero   Weight: 89.4 kg (197 lb 3.2 oz)   Height: 170.2 cm (5' 7)     Body mass index is 30.89 kg/m?Marland Kitchen   Constitutional: Alert, no distress.    Eyes: Conjunctiva are non-icteric and are no injected.  ENT: Normocephalic, atraumatic.  CV: Heart exam shows regular rhythm, no murmur, no S3, no S4.    RESP: Lungs are clear to auscultation bilat, with no rales, rhonchi, or wheezing.  GI: Normal bowel sounds.  Abdomen is soft, nontender.  No hepatosplenomegaly, no mass.   MS: no joint swelling or erythema.  No CVAT.  SKIN:  No rash.  NEURO:  CN's 2-12 grossly intact.  PSYCH:  Good eye contact, normal affect.         Assessment and Plan:  Lori Frye was seen today for post-hospital follow up.    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 when she presented for flank pain.  Found to have bacteria and pyuria.  CT showed right hydroureteronephrosis with urothelial thickening and periureteral stranding digestive of either having passed a stone recently or urinary tract infection.  She was treated with IV antibiotics and transition to p.o. cefpodoxime which she is tolerating well.  She will finish 10-day course.  Her urine culture done in the hospital is still showing no growth at 1 day without a apparent final result.  Clinically, she is doing well and feels back to normal.    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  Followed by Dr. Valentina Lucks at [facility].  Has upcoming appointment with her eye doctor for follow-up.    Follow up with PCP as previously scheduled (June 2024) or sooner if needed.

## 2022-04-10 NOTE — Telephone Encounter
Lori Frye called to inform us she was recently in the hospital at [facility] and is requesting Dr Edwin Dada look at recent lab work to see if she needs any earlier follow up.

## 2022-04-15 ENCOUNTER — Encounter: Admit: 2022-04-15 | Discharge: 2022-04-15 | Payer: Private Health Insurance - Indemnity

## 2022-05-04 ENCOUNTER — Encounter: Admit: 2022-05-04 | Discharge: 2022-05-04 | Payer: Private Health Insurance - Indemnity

## 2022-06-02 ENCOUNTER — Encounter: Admit: 2022-06-02 | Discharge: 2022-06-02 | Payer: Private Health Insurance - Indemnity

## 2022-08-11 ENCOUNTER — Encounter: Admit: 2022-08-11 | Discharge: 2022-08-11 | Payer: Private Health Insurance - Indemnity

## 2022-09-01 ENCOUNTER — Encounter: Admit: 2022-09-01 | Discharge: 2022-09-01 | Payer: Private Health Insurance - Indemnity

## 2022-09-02 ENCOUNTER — Encounter: Admit: 2022-09-02 | Discharge: 2022-09-02 | Payer: Private Health Insurance - Indemnity

## 2022-09-02 ENCOUNTER — Ambulatory Visit: Admit: 2022-09-02 | Discharge: 2022-09-03 | Payer: Private Health Insurance - Indemnity

## 2022-09-02 DIAGNOSIS — E042 Nontoxic multinodular goiter: Secondary | ICD-10-CM

## 2022-09-02 DIAGNOSIS — M1990 Unspecified osteoarthritis, unspecified site: Secondary | ICD-10-CM

## 2022-09-02 DIAGNOSIS — E039 Hypothyroidism, unspecified: Secondary | ICD-10-CM

## 2022-09-02 DIAGNOSIS — E785 Hyperlipidemia, unspecified: Secondary | ICD-10-CM

## 2022-09-02 DIAGNOSIS — D539 Nutritional anemia, unspecified: Secondary | ICD-10-CM

## 2022-09-02 DIAGNOSIS — I951 Orthostatic hypotension: Secondary | ICD-10-CM

## 2022-09-02 DIAGNOSIS — E1065 Type 1 diabetes mellitus with hyperglycemia: Secondary | ICD-10-CM

## 2022-09-02 DIAGNOSIS — M8580 Other specified disorders of bone density and structure, unspecified site: Secondary | ICD-10-CM

## 2022-09-02 DIAGNOSIS — N189 Chronic kidney disease, unspecified: Secondary | ICD-10-CM

## 2022-09-02 DIAGNOSIS — N1831 Anemia in stage 3a chronic kidney disease  (HCC): Secondary | ICD-10-CM

## 2022-09-02 DIAGNOSIS — R42 Dizziness and giddiness: Secondary | ICD-10-CM

## 2022-09-02 DIAGNOSIS — E782 Mixed hyperlipidemia: Secondary | ICD-10-CM

## 2022-09-02 DIAGNOSIS — E119 Type 2 diabetes mellitus without complications: Secondary | ICD-10-CM

## 2022-09-02 DIAGNOSIS — H269 Unspecified cataract: Secondary | ICD-10-CM

## 2022-09-02 DIAGNOSIS — N12 Tubulo-interstitial nephritis, not specified as acute or chronic: Secondary | ICD-10-CM

## 2022-09-02 NOTE — Patient Instructions
Please call [facility] scheduling at [phone] to schedule your mammogram.

## 2022-09-02 NOTE — Progress Notes
Date of service 09/02/2022    Lori Frye presents today for a Complete Physical Exam, f/u on type 1 DM c/b nephropathy, hld, hypothyroidism, CKD, anemia r/t CKD3, lightheadedness    Patient Reported Other  What topic(s) would you like to cover during your appointment?:  Yearly check  Please describe the issue(s) and history with the issue (location, severity, duration, symptoms, etc.).:  N/A  What has been done so far to take care of the issue(s)?:  N/A  What are your goals for this visit?:  Catch up    Lori Frye presents today for her physical.  I last saw her in June 2023 for her prior physical.    She has type 1 diabetes and is under the care of Dr. Roger Shelter with [facility].  She saw Dr. Valentina Lucks on July 10.  Her A1c that day was 6.5.  She did note to Dr. Valentina Lucks that she is having lightheadedness with standing frequently.  Dr. Valentina Lucks recommended she decrease her 5 mg tab of lisinopril to one half tab daily.  She started this a couple of days ago and thinks she is having a little bit less lightheadedness.  She tells me today that she was started on the lisinopril for renal protection and not for actual hypertension.  She notices that she gets lightheaded more often after lunch in the afternoon particularly when she gets up and starts walking.  She does not have a blood pressure monitor currently but was considering getting one.    She was treated for pyelonephritis in February.  States it came on very quickly and was rapidly gotten worse so she went to the hospital.  They admitted her and started treatment and she felt better almost immediately.  She followed up with Dr. Jeral Pinch in the urology clinic after that and follow-up imaging showed resolution of hydronephrosis.    She has a history of chronic kidney disease stage III and anemia related to this.  Back when she was in [place] their threshold for Aranesp was 11 so she got injections of those.  Dr. Lady Gary threshold is less than 10.  She did have a hemoglobin in the 9 range when she was in the hospital.  She has not had it rechecked.    She was supposed to have iron, folate and CBC and B12 level drawn after seeing him in January 2024.  Dr. Valentina Lucks did check B12 level recently and it was 377 and started her on B12 every other day.    She has been losing weight intentionally by diet.  She states it is very difficult for her to exercise because her blood sugar drops easily.    She notes also that she is slowly titrating down recommendation of Dr. Valentina Lucks on her gabapentin.  States that she never did have neuropathy pain just numbness.  She has been on gabapentin for 30 years.  She was under the impression that it was protecting her somehow from neuropathy.  When her mother recently was put on gabapentin and did not feel right on it she wondered how it was affecting her.  States she was actually on 2 gabapentin twice a day.  She is on now a total of 3 as she is weaning down and so far no pain.    Past Medical History:   Diagnosis Date    Acquired hypothyroidism     Anemia in stage 3a chronic kidney disease  (HCC) 08/14/2019    Here for hematologic opinion regarding  anemia.  She had been living in [place] most recently but recently moved back to the [facility] area and presents to establish hematologic follow-up.  She was aware that she was anemic as far back as 2011 or 2012 when she was living in [place].  She recalls being evaluated by a hematologist but shortly thereafter moved to [facility].  She was ul    Arthritis 2018    Cataract 2016    CKD (chronic kidney disease)     Diabetes mellitus (HCC)     Hyperlipemia     Multiple thyroid nodules 07/02/2020    Osteopenia 07/02/2020    Pyelonephritis 2024    Uncontrolled type 1 diabetes mellitus with hyperglycemia, with long-term current use of insulin (HCC) 10/26/2019    Unspecified deficiency anemia 2012     Surgical History:   Procedure Laterality Date    HX SALPINGO-OOPHORECTOMY  2009 HX HYSTERECTOMY  2011    BREAST SURGERY  2009    Breast augmentation    COLONOSCOPY      HX APPENDECTOMY      HX BREAST AUGMENTATION      HX CESAREAN SECTION      HX CHOLECYSTECTOMY      HX COSMETIC SURGERY      HX EYE SURGERY      HX SURGERY      De Quervain's release     HYSTERECTOMY      OVARY SURGERY  2011    Removal    SURGERY      trigger finger release     Family History   Problem Relation Name Age of Onset    Thyroid Disease Mother Herbert Seta     Thyroid Disease Brother Tawanna Cooler     Asthma Paternal Arne Cleveland     Cancer Maternal Grandfather Alec     Heart Disease Paternal Latrelle Dodrill     Heart Attack Brother      None Reported Father       Social History     Tobacco Use    Smoking status: Never    Smokeless tobacco: Never   Substance Use Topics    Alcohol use: Yes     Alcohol/week: 6.0 standard drinks of alcohol     Types: 6 Cans of beer per week   Married to [place] (married twice before once divorced once widowed).  Has 2 children ages 60 and 38.  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  Does not exercise regularly.  Feels safe at home.  Wears her seatbelt regularly.  She and her husband like to ride motorcycles.  She has a 32-year-old and 89-year-old grandchild in the area which is why she moved back from [facility].       Review of Systems   Constitutional: Positive for weight loss.   Cardiovascular:  Negative for chest pain.   Respiratory:  Negative for shortness of breath.        Allergies   Allergen Reactions    Pcn [Penicillins] HIVES    Sulfa (Sulfonamide Antibiotics) HIVES       Objective:      ASPIRIN PO Take 81 mg by mouth daily.    gabapentin (NEURONTIN) 300 mg capsule Take one capsule by mouth daily.    glucagon (BAQSIMI) 3 mg/actuation nasal spray Use 1 spray in single nostril. If no response, may repeat in 15 minutes using second device.    insulin pump -ASPART- Patients Own by SubQ Pump route  Three times daily with meals and as needed.    levothyroxine (SYNTHROID) 100 mcg tablet Take one tablet by mouth daily.    lisinopriL (ZESTRIL) 5 mg tablet Take one tablet by mouth daily.    rosuvastatin (CRESTOR) 40 mg tablet Take one tablet by mouth daily.   *note reduced lisinopril to 1/2 tab daily 2-3 days ago    Vitals:    09/02/22 0744 09/02/22 0842 09/02/22 0843   BP: 117/70 110/75 98/75   BP Source: Arm, Left Upper Arm, Left Upper Arm, Left Upper   Pulse: 82 74 77   Resp: 16     SpO2: 100%     Weight: 84.8 kg (187 lb)     Height: 170.2 cm (5' 7)       Depression:  Patient Scores:  PHQ-2: PHQ-2 Score: 0 (08/26/2022 12:44 PM)    PHQ-9: No data recorded  Interventions:  PHQ-2: PHQ-2 Score less than 3: No follow-up or recommendations are necessary at this time (08/26/2022 12:44 PM)    Depression Interventions PHQ-2/9: No data recorded    BMI:  Body mass index is 29.29 kg/m?Marland Kitchen  Plan in Progress: BMI Plan is in Progress (09/02/2022  9:58 AM)      Physical Exam     General: Pleasant well developed, well nourished overwt white female in no acute distress. Heent: Pupils equal round, reactive to light. Extraocular movements intact. Mucous membranes moist, oropharynx, TMs clear. Neck: No LAD, thyromegaly or jvp. Lungs: clear throughout, normal respiratory effort. CV: Regular rate and rhythm, no murmur. 2+ distal pulses. Abdomen: Soft, non-tender, non-distended, +BS, no masses. Insulin pump noted. Ext: no clubbing, cyanosis or edema. Warm and dry. Neuro: Alert and oriented x 3; CNII-XII intact, no focal deficits. Psy: Mood: good. Affect: normal.     Diabetic Foot Exam       Bilateral vascular, sensation, integument are normal:  Yes except decreased sensation to monofilament below L 1st/2nd toes     Assessment and Plan:  Wellness exam: Clinical exam within normal limits other than elevated BMI.  Labs reviewed from 08/26/22, additional labs needed will be done today.  Cologuard 09/10/20, nl, q 3 yrs, mammogram 05/30/21, annually, referred for next.  She is status post complete hysterectomy and BSO for benign causes.  No Pap smears needed.  Tdap 07/19/20, q 10 yrs.  She did have both Shingrix vaccines. Annual flu vaccine, covid vaccine recommended in the fall. RSV vaccine recommended at age 66.   Problem list:  1.  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  Follow-up with diabetes specialist Dr. Valentina Lucks and she is on an insulin pump.  Has been on gabapentin for neuropathy management-weaning down slowly to see if needed per Dr. Valentina Lucks.  Continue close follow-up with her eye specialist Dr. Earle Gell at [place]. Dil eye exam requested. Ctn ace-I, statin, asa.  2.  Hypertension.  REmoved from problem list as pt notes her low dose ace-I for renal protection not high bp.  3.  Hyperlipidemia.  On max dose of Crestor.  Recent LDL at goal of <100.  4.  Hypothyroidism.  On levothyroxine.  Ctn same. Recent TFTs normal.  5.  Goiter and thyroid nodule history.  This will be followed by her endocrinologist, Dr. Valentina Lucks.   6.  CKD stage III.  She is established with Dr. Gretchen Short.  Avoid NSAIDs. Recheck renal fxn today.  7.  Nonmorbid obesity-now improved to overwt range BMI 29.  Healthy diet--helping w/ wt loss; regular exercise  recommended, however, recommended she discuss pump settings with Dr. Valentina Lucks before starting exercise program d/t propensity to become hypoglycemic even w/ going for a walk.  8.  Lightheadedness c/w Orthostasis-discussed. Agree w/ reduction lisinopril. Will need further reduction or to stop it if sxs persist. Also has long h/o anemia and most recent hgb 9.1 in Feb 2024--recheck today.   9.  Anemia of CKD. Recheck cbc, iron today CC: Dr. Benjiman Core.      1 yr-sooner if needed

## 2022-09-03 DIAGNOSIS — Z1231 Encounter for screening mammogram for malignant neoplasm of breast: Secondary | ICD-10-CM

## 2022-09-03 DIAGNOSIS — D631 Anemia in chronic kidney disease: Secondary | ICD-10-CM

## 2022-09-03 DIAGNOSIS — Z0000 Encounter for general adult medical examination without abnormal findings: Secondary | ICD-10-CM

## 2022-09-03 DIAGNOSIS — N1831 Chronic kidney disease, stage 3a (HCC): Secondary | ICD-10-CM

## 2022-09-03 LAB — COMPREHENSIVE METABOLIC PANEL
ALBUMIN: 4.2 g/dL (ref 3.5–5.0)
ALK PHOSPHATASE: 59 U/L (ref 25–110)
ALT: 10 U/L (ref 7–56)
ANION GAP: 10 10*3/uL (ref 3–12)
AST: 17 U/L (ref 7–40)
BLD UREA NITROGEN: 32 mg/dL — ABNORMAL HIGH (ref 7–25)
CALCIUM: 9.8 mg/dL (ref 8.5–10.6)
CHLORIDE: 103 MMOL/L — ABNORMAL LOW (ref 98–110)
CO2: 27 MMOL/L (ref 21–30)
CREATININE: 1.6 mg/dL — ABNORMAL HIGH (ref 0.4–1.00)
EGFR: 37 mL/min — ABNORMAL LOW (ref >60)
GLUCOSE,PANEL: 150 mg/dL — ABNORMAL HIGH (ref 70–100)
POTASSIUM: 4.6 MMOL/L — ABNORMAL LOW (ref 3.5–5.1)
SODIUM: 140 MMOL/L — ABNORMAL LOW (ref 137–147)
TOTAL BILIRUBIN: 0.3 mg/dL (ref 0.2–1.3)
TOTAL PROTEIN: 6.6 g/dL (ref 6.0–8.0)

## 2022-09-03 LAB — IRON + BINDING CAPACITY + %SAT+ FERRITIN
IRON BINDING: 289 ug/dL (ref 270–380)
IRON: 85 ug/dL (ref 50–160)

## 2022-09-03 LAB — CBC AND DIFF
ABSOLUTE BASO COUNT: 0 10*3/uL (ref 0–0.20)
ABSOLUTE EOS COUNT: 0 10*3/uL (ref 0–0.45)
ABSOLUTE MONO COUNT: 0.5 10*3/uL (ref 0–0.80)
WBC COUNT: 4.8 10*3/uL (ref 4.5–11.0)

## 2022-09-03 LAB — FOLATE, SERUM: SERUM FOLATE: 7.6 ng/mL (ref >3.9)

## 2022-10-06 ENCOUNTER — Encounter: Admit: 2022-10-06 | Discharge: 2022-10-06 | Payer: Private Health Insurance - Indemnity

## 2022-11-05 ENCOUNTER — Encounter: Admit: 2022-11-05 | Discharge: 2022-11-05 | Payer: Private Health Insurance - Indemnity

## 2023-02-21 ENCOUNTER — Encounter: Admit: 2023-02-21 | Discharge: 2023-02-21 | Payer: BC Managed Care – PPO

## 2023-02-22 ENCOUNTER — Encounter: Admit: 2023-02-22 | Discharge: 2023-02-22 | Payer: BC Managed Care – PPO

## 2023-02-22 ENCOUNTER — Encounter: Admit: 2023-02-22 | Discharge: 2023-02-22 | Payer: Private Health Insurance - Indemnity

## 2023-02-22 NOTE — Telephone Encounter
ED Discharge Follow Up  Reached patient: No - sent MyChart message per protocol   Patient Date of Birth: [birth year]  Admission Information  Hospital Name : [facility]  ED Admission Date: 02/18/23   ED Discharge Date: 02/18/23   Admission Diagnosis:  Flu symptoms  Discharge Diagnosis: Nausea vomiting and diarrhea (Primary Dx);   Gastroenteritis   Hospital Services: Unplanned  Today's call is 4 (calendar) days post discharge    Medication Reconciliation  Changes to pre-ED visit medications? No  Were new prescriptions filled? N/A  Ordered Prescriptions  Prescription Sig Dispensed Refills Start Date End Date   dicyclomine (Bentyl) 20 MG tablet  Take 1 tablet (20 mg total) by mouth 4 (four) times a day (before meals and nightly). 120 tablet    02/18/2023 02/18/2024   ondansetron ODT (Zofran-ODT) 4 MG disintegrating tablet  Take 1 tablet (4 mg total) by mouth every 8 (eight) hours if needed for nausea or vomiting for up to 7 days. 20 tablet    02/18/2023 02/25/2023     Meds reviewed and reconciled? Yes    Current Outpatient Medications   Medication Instructions    ASPIRIN PO 81 mg, Oral, DAILY    gabapentin (NEURONTIN) 300 mg, Oral, DAILY    glucagon (BAQSIMI) 3 mg/actuation nasal spray Use 1 spray in single nostril. If no response, may repeat in 15 minutes using second device.    insulin pump -ASPART- Patients Own SubQ Pump, THREE TIMES DAILY WITH MEALS & PRN    levothyroxine (SYNTHROID) 100 mcg, Oral, DAILY    lisinopril (ZESTRIL) 5 mg, Oral, DAILY    rosuvastatin (CRESTOR) 40 mg, Oral, DAILY              Scheduling Follow-up Appointment  Upcoming appointments:   Future Appointments   Date Time Provider Department Center   02/26/2023 12:30 PM [facility] None   02/26/2023  1:00 PM Corky Mull, MD [facility] [place] Exam   09/03/2023  8:00 AM Nedra Hai, MD [facility]     When was patient?s last PCP visit: 09/02/2022  PCP primary location: [facility]  PCP appointment scheduled? No, no ED f/u  Specialist appointment scheduled? No  Is assistance with transportation needed?  unknown  MyChart message sent? Active in MyChart. MyChart message sent.  Artera text sent? Yes      ED Communication   Did patient call clinic prior to going to ED? No  Reason patient went to ED: Transportation Transported by EMS    Lavell Islam

## 2023-02-24 ENCOUNTER — Encounter: Admit: 2023-02-24 | Discharge: 2023-02-24 | Payer: BC Managed Care – PPO

## 2023-02-24 DIAGNOSIS — N1831 Anemia in stage 3a chronic kidney disease (HCC): Secondary | ICD-10-CM

## 2023-02-26 ENCOUNTER — Encounter: Admit: 2023-02-26 | Discharge: 2023-02-26 | Payer: BC Managed Care – PPO

## 2023-02-26 DIAGNOSIS — N1831 Anemia in stage 3a chronic kidney disease (HCC): Secondary | ICD-10-CM

## 2023-02-26 NOTE — Progress Notes
Date of Service: 02/26/2023      Subjective:             Reason for Visit:  Follow Up      Lori Frye is a 63 y.o. female.     Cancer Staging   No matching staging information was found for the patient.      History of Present Illness    Problem   Anemia in Stage 3a Chronic Kidney Disease (Hcc)    Here for hematologic opinion regarding anemia.    She had been living in [place] most recently but recently moved back to the [facility] area and presents to establish hematologic follow-up.    She was aware that she was anemic as far back as 2011 or 2012 when she was living in [place].  She recalls being evaluated by a hematologist but shortly thereafter moved to [facility].    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  It looks like she had a hemoglobin of around 10.8.  The remainder of her CBC was completely normal and she did have an erythropoietin level that was only 4.  Based on that she has been supplemented with Aranesp and only requires injections about twice a year.    She had labs completed recently at [facility] when she was seeing endocrinology in June 1 her hemoglobin was 11 with a normal white blood cell count and platelet count and unremarkable differential and red cell indices.  Creatinine at that time was 1.1.    She actually feels pretty good right now.  She has longstanding diabetes for over 30 years but it is well managed.  She is also on thyroid replacement, medicine for cholesterol and an ACE inhibitor.    She has beer occasionally but does not drink heavily.  She is a non-smoker there is no other drug use.  She is not aware of any family history of hematologic or kidney disorders and she has seen a nephrologist in the past and plans to establish with a nephrologist at some point.    I saw her for the first time in June 2021 when her hemoglobin was 10.9.  Folate and B12 and iron stores were normal erythropoietin was 12.7 with a creatinine of 1.26 and a GFR of 44 mL/min.  Protein electrophoresis and free light chains were normal.  She has been evaluated by nephrology.    Overall she has been feeling well with no new changes.  She reports her blood sugars have been doing well.    February 19, 2020 hemoglobin 10.7 with the remainder of her CBC unchanged.  Ferritin was 286 with an iron saturation of 28%.    January 20, 2021 hemoglobin 10.6 with otherwise unremarkable CBC.  She did have an iron panel November 19, 2020 with ferritin 271 and an iron saturation of 22%.  Most recent creatinine November 19, 2020 1.21 with an estimated GFR of 52.    It sounds like her diabetes has been stable.  She apparently had a thyroid biopsy that was inconclusive so she is planning to have a thyroidectomy in the near future.  Otherwise no changes from hematologic standpoint and it looks like over the course of the past year, her hemoglobin is stayed above 10.  On February 26, 2023 hemoglobin 10.7 hematocrit 32.9 CBC otherwise unremarkable.            Review of Systems   All other systems reviewed and are negative.  Objective:          ALPRAZolam (XANAX) 0.5 mg tablet Take one tablet by mouth nightly as needed.    ASPIRIN PO Take 81 mg by mouth daily.    DEXCOM G6 TRANSMITTER transmitter device CHANGE EVERY 90 DAYS    gabapentin (NEURONTIN) 300 mg capsule Take one capsule by mouth daily.    glucagon (BAQSIMI) 3 mg/actuation nasal spray Use 1 spray in single nostril. If no response, may repeat in 15 minutes using second device.    insulin pump -ASPART- Patients Own by [employer] Pump route Three times daily with meals and as needed.    levothyroxine (SYNTHROID) 100 mcg tablet Take one tablet by mouth daily.    lisinopriL (ZESTRIL) 5 mg tablet Take one tablet by mouth daily.    rosuvastatin (CRESTOR) 40 mg tablet Take one tablet by mouth daily.     Vitals:    02/26/23 1234 02/26/23 1244 02/26/23 1245   BP: 129/71     BP Source: Arm, Right Upper     Pulse: 86     Temp: 37.1 ?C (98.8 ?F) Resp: 16     SpO2: 96%     O2 Device:  None (Room air)    TempSrc: Oral Oral    PainSc: Zero  Zero   Weight: 86.2 kg (190 lb)       Body mass index is 29.76 kg/m?Marland Kitchen     Pain Score: Zero         Pain Addressed:  N/A    Patient Evaluated for a Clinical Trial: No treatment clinical trial available for this patient.     [place] Cooperative Oncology Group performance status is 0, Fully active, able to carry on all pre-disease performance without restriction.Marland Kitchen     Physical Exam  Vitals reviewed.   Constitutional:       General: She is not in acute distress.     Appearance: She is well-developed.   HENT:      Head: Normocephalic and atraumatic.   Eyes:      General: No scleral icterus.     Conjunctiva/sclera: Conjunctivae normal.   Neck:      Thyroid: No thyromegaly.   Cardiovascular:      Rate and Rhythm: Normal rate and regular rhythm.      Heart sounds: Normal heart sounds.   Pulmonary:      Effort: Pulmonary effort is normal.      Breath sounds: Normal breath sounds.   Abdominal:      General: Bowel sounds are normal. There is no distension.      Palpations: Abdomen is soft. There is no mass.   Musculoskeletal:         General: No swelling. Normal range of motion.      Cervical back: Neck supple.   Lymphadenopathy:      Cervical: No cervical adenopathy.      Upper Body:      Right upper body: No supraclavicular or axillary adenopathy.      Left upper body: No supraclavicular or axillary adenopathy.   Skin:     General: Skin is warm and dry.   Neurological:      General: No focal deficit present.      Mental Status: She is alert and oriented to person, place, and time.   Psychiatric:         Mood and Affect: Mood normal.         Behavior: Behavior normal.  Thought Content: Thought content normal.         Judgment: Judgment normal.            Results for orders placed or performed in visit on 02/26/23 (from the past 2 weeks)   CBC AND DIFF   Result Value Ref Range    White Blood Cells 6.30 4.50 - 11.00 10*3/uL Red Blood Cells 3.67 (L) 4.00 - 5.00 10*6/uL    Hemoglobin 10.7 (L) 12.0 - 15.0 g/dL    Hematocrit 54.0 (L) 36.0 - 45.0 %    MCV 89.6 80.0 - 100.0 fL    MCH 29.1 26.0 - 34.0 pg    MCHC 32.5 32.0 - 36.0 g/dL    RDW 98.1 19.1 - 47.8 %    Platelet Count 268 150 - 400 10*3/uL    MPV 8.0 7.0 - 11.0 fL    Neutrophils 70 41 - 77 %    Lymphocytes 21 (L) 24 - 44 %    Monocytes 8 4 - 12 %    Eosinophils 1 0 - 5 %    Basophils 1 0 - 2 %    Absolute Neutrophil Count 4.40 1.80 - 7.00 10*3/uL    Absolute Lymph Count 1.30 1.00 - 4.80 10*3/uL    Absolute Monocyte Count 0.50 0.00 - 0.80 10*3/uL    Absolute Eosinophil Count 0.10 0.00 - 0.45 10*3/uL    Absolute Basophil Count 0.00 0.00 - 0.20 10*3/uL          Assessment and Plan:    Problem List Items Addressed This Visit       Anemia in stage 3a chronic kidney disease (HCC)     Chronic normocytic anemia.  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  She has had GFR is in the 45-60 range in the past.  I have explained to her that typically with anemia of chronic kidney disease we do not institute if the hemoglobin is above 10 and treat to maintain a hemoglobin just over 10 based on some of the other historical safety data.  We have elected for observation her hemoglobin is stayed fairly stable in about the 10.5 range.  Therefore she still does not require for Red cell growth factors.  We have discussed continued annual follow-up versus follow-up with primary care and she can return here if things worsen.  We are also updating some vitamin levels today and we will contact her if she needs any supplementation.  Otherwise just encouraged her to have good healthy habits and again we could see her as needed in the future.    In regards to her chronic kidney disease and diabetes she continues to work on these things and she follows with nephrology.    Planning a thyroidectomy in the near term.      She verbalized understanding of the discussion and did not have any other questions.                     This note was created with partial dictation using a dragon dictation system, please notify us if you notice any errors of omissions or content.

## 2023-04-13 ENCOUNTER — Encounter: Admit: 2023-04-13 | Discharge: 2023-04-13 | Payer: BC Managed Care – PPO

## 2023-04-20 ENCOUNTER — Encounter: Admit: 2023-04-20 | Discharge: 2023-04-20 | Payer: BC Managed Care – PPO

## 2023-06-14 ENCOUNTER — Encounter: Admit: 2023-06-14 | Discharge: 2023-06-14 | Payer: BLUE CROSS/BLUE SHIELD

## 2023-06-15 ENCOUNTER — Encounter: Admit: 2023-06-15 | Discharge: 2023-06-15 | Payer: BLUE CROSS/BLUE SHIELD

## 2023-06-15 NOTE — Telephone Encounter
 ED Discharge Follow Up  Reached patient: No - sent MyChart message per protocol   Patient Date of Birth: 1961/02/12  Admission Information  Hospital Name : [facility]  ED Admission Date: 06/13/23   ED Discharge Date: 06/13/23   Admission Diagnosis:  Sore throat  Discharge Diagnosis: Peritonsillar abscess (Primary Dx)   Hospital Services: Unplanned  Today's call is 2 (calendar) days post discharge    Medication Reconciliation  Changes to pre-ED visit medications? Yes  Were new prescriptions filled? N/A  Ordered Prescriptions  Prescription Sig Dispense Quantity Refills Last Filled Start Date End Date   clindamycin (CLEOCIN) 300 MG capsule  Take 1 capsule (300 mg total) by mouth 4 (four) times a day. 40 capsule      06/13/2023 06/23/2023     Meds reviewed and reconciled? Yes    Current Outpatient Medications   Medication Instructions    ALPRAZolam (XANAX) 0.5 mg, NIGHTLY PRN    ASPIRIN PO 81 mg, DAILY    DEXCOM G6 TRANSMITTER transmitter device CHANGE EVERY 90 DAYS    gabapentin (NEURONTIN) 300 mg, DAILY    glucagon (BAQSIMI) 3 mg/actuation nasal spray Use 1 spray in single nostril. If no response, may repeat in 15 minutes using second device.    insulin pump -ASPART- Patients Own THREE TIMES DAILY WITH MEALS & PRN    levothyroxine (SYNTHROID) 100 mcg, DAILY    lisinopril (ZESTRIL) 5 mg, DAILY    rosuvastatin (CRESTOR) 40 mg, DAILY          Scheduling Follow-up Appointment  Upcoming appointments:   Future Appointments   Date Time Provider Department Center   09/03/2023  8:00 AM Verline Glow, MD [facility]     When was patient?s last PCP visit: 09/02/2022  PCP primary location: [facility]  PCP appointment scheduled? No, no ED f/u  Specialist appointment scheduled? No  Is assistance with transportation needed?  unknown  MyChart message sent? Active in MyChart. MyChart message sent.  Artera text sent? No      ED Communication   Did patient call clinic prior to going to ED? Yes, seen at urgent care 06/12/23  Reason patient went to ED: Unable to obtain    Lori Frye

## 2023-08-27 ENCOUNTER — Encounter: Admit: 2023-08-27 | Discharge: 2023-08-27 | Payer: BLUE CROSS/BLUE SHIELD

## 2023-09-03 ENCOUNTER — Encounter: Admit: 2023-09-03 | Discharge: 2023-09-03 | Payer: BLUE CROSS/BLUE SHIELD

## 2023-09-13 ENCOUNTER — Encounter: Admit: 2023-09-13 | Discharge: 2023-09-13 | Payer: BLUE CROSS/BLUE SHIELD

## 2023-10-06 ENCOUNTER — Encounter: Admit: 2023-10-06 | Discharge: 2023-10-06 | Payer: BLUE CROSS/BLUE SHIELD

## 2023-11-04 ENCOUNTER — Ambulatory Visit: Admit: 2023-11-04 | Discharge: 2023-11-04 | Payer: BLUE CROSS/BLUE SHIELD

## 2023-11-04 ENCOUNTER — Encounter: Admit: 2023-11-04 | Discharge: 2023-11-04 | Payer: BLUE CROSS/BLUE SHIELD

## 2024-03-07 ENCOUNTER — Encounter: Admit: 2024-03-07 | Discharge: 2024-03-07 | Payer: BLUE CROSS/BLUE SHIELD
